# Patient Record
Sex: Female | Born: 1935 | Race: White | Hispanic: No | Smoking: Never smoker
Health system: Southern US, Community
[De-identification: ages and names within clinical notes are randomized; demographics above are authoritative.]

## PROBLEM LIST (undated history)

## (undated) DIAGNOSIS — I82403 Acute embolism and thrombosis of unspecified deep veins of lower extremity, bilateral: Secondary | ICD-10-CM

## (undated) DIAGNOSIS — I10 Essential (primary) hypertension: Secondary | ICD-10-CM

## (undated) DIAGNOSIS — I2699 Other pulmonary embolism without acute cor pulmonale: Secondary | ICD-10-CM

## (undated) DIAGNOSIS — M1990 Unspecified osteoarthritis, unspecified site: Secondary | ICD-10-CM

## (undated) DIAGNOSIS — F32A Depression, unspecified: Secondary | ICD-10-CM

## (undated) DIAGNOSIS — D689 Coagulation defect, unspecified: Secondary | ICD-10-CM

## (undated) DIAGNOSIS — L0390 Cellulitis, unspecified: Secondary | ICD-10-CM

## (undated) DIAGNOSIS — M359 Systemic involvement of connective tissue, unspecified: Secondary | ICD-10-CM

## (undated) DIAGNOSIS — F329 Major depressive disorder, single episode, unspecified: Secondary | ICD-10-CM

## (undated) HISTORY — PX: HERNIA REPAIR: SHX51

## (undated) HISTORY — PX: PARTIAL HYSTERECTOMY: SHX80

## (undated) HISTORY — DX: Essential (primary) hypertension: I10

## (undated) HISTORY — DX: Cellulitis, unspecified: L03.90

## (undated) HISTORY — PX: CATARACT EXTRACTION: SUR2

## (undated) HISTORY — PX: OTHER SURGICAL HISTORY: SHX169

## (undated) HISTORY — PX: FOOT SURGERY: SHX648

---

## 1994-07-18 ENCOUNTER — Ambulatory Visit: Admit: 1994-07-18 | Disposition: A | Discharge status: Home / Self Care | Payer: Self-pay | Source: Ambulatory Visit

## 1995-06-22 ENCOUNTER — Ambulatory Visit: Admit: 1995-06-22 | Disposition: A | Discharge status: Home / Self Care | Payer: Self-pay | Source: Ambulatory Visit

## 1996-04-12 ENCOUNTER — Ambulatory Visit: Admission: RE | Admit: 1996-04-12 | Payer: Self-pay | Source: Ambulatory Visit | Admitting: Foot & Ankle Surgery

## 1996-09-04 ENCOUNTER — Emergency Department: Admit: 1996-09-04 | Payer: Self-pay | Admitting: Emergency Medicine

## 1996-09-05 ENCOUNTER — Ambulatory Visit
Admit: 1996-09-05 | Disposition: A | Discharge status: Home / Self Care | Payer: Self-pay | Source: Ambulatory Visit | Admitting: Family Medicine

## 1999-03-18 ENCOUNTER — Inpatient Hospital Stay
Admission: AD | Admit: 1999-03-18 | Disposition: A | Discharge status: Home / Self Care | Payer: Self-pay | Source: Ambulatory Visit | Admitting: Family Medicine

## 2000-06-23 ENCOUNTER — Ambulatory Visit: Admit: 2000-06-23 | Disposition: A | Discharge status: Home / Self Care | Payer: Self-pay | Source: Ambulatory Visit

## 2002-05-07 ENCOUNTER — Ambulatory Visit: Admission: RE | Admit: 2002-05-07 | Payer: Self-pay | Source: Ambulatory Visit | Admitting: Orthopaedic Surgery

## 2006-05-31 ENCOUNTER — Encounter: Admission: RE | Admit: 2006-05-31 | Discharge: 2006-05-31 | Payer: Self-pay | Admitting: Orthopedic Surgery

## 2007-04-23 ENCOUNTER — Encounter (INDEPENDENT_AMBULATORY_CARE_PROVIDER_SITE_OTHER): Payer: Self-pay | Admitting: Emergency Medicine

## 2007-04-23 ENCOUNTER — Emergency Department (HOSPITAL_COMMUNITY): Admission: EM | Admit: 2007-04-23 | Discharge: 2007-04-23 | Payer: Self-pay | Admitting: Emergency Medicine

## 2007-04-23 ENCOUNTER — Ambulatory Visit: Payer: Self-pay | Admitting: Surgery

## 2007-05-21 ENCOUNTER — Emergency Department (HOSPITAL_COMMUNITY): Admission: EM | Admit: 2007-05-21 | Discharge: 2007-05-22 | Payer: Self-pay | Admitting: Emergency Medicine

## 2007-05-24 ENCOUNTER — Encounter: Admission: RE | Admit: 2007-05-24 | Discharge: 2007-05-24 | Payer: Self-pay | Admitting: Internal Medicine

## 2010-07-12 ENCOUNTER — Encounter: Admission: RE | Admit: 2010-07-12 | Discharge: 2010-07-12 | Payer: Self-pay | Admitting: Surgery

## 2010-08-03 ENCOUNTER — Encounter: Admission: RE | Admit: 2010-08-03 | Discharge: 2010-08-03 | Payer: Self-pay | Admitting: Surgery

## 2010-08-29 HISTORY — PX: PELVIC LAPAROSCOPY: SHX162

## 2010-08-29 HISTORY — PX: ABDOMINAL SURGERY: SHX537

## 2010-08-29 HISTORY — PX: OOPHORECTOMY: SHX86

## 2010-10-28 ENCOUNTER — Ambulatory Visit (INDEPENDENT_AMBULATORY_CARE_PROVIDER_SITE_OTHER): Payer: Medicare Other | Admitting: Obstetrics and Gynecology

## 2010-10-28 DIAGNOSIS — N83209 Unspecified ovarian cyst, unspecified side: Secondary | ICD-10-CM

## 2010-10-28 DIAGNOSIS — N8111 Cystocele, midline: Secondary | ICD-10-CM

## 2010-10-28 DIAGNOSIS — K439 Ventral hernia without obstruction or gangrene: Secondary | ICD-10-CM

## 2010-12-02 ENCOUNTER — Other Ambulatory Visit: Payer: Self-pay | Admitting: Obstetrics and Gynecology

## 2010-12-02 ENCOUNTER — Institutional Professional Consult (permissible substitution) (INDEPENDENT_AMBULATORY_CARE_PROVIDER_SITE_OTHER): Payer: Medicare Other | Admitting: Obstetrics and Gynecology

## 2010-12-02 ENCOUNTER — Encounter (HOSPITAL_COMMUNITY): Payer: Medicare Other

## 2010-12-02 ENCOUNTER — Other Ambulatory Visit (HOSPITAL_COMMUNITY): Payer: Self-pay | Admitting: Surgery

## 2010-12-02 ENCOUNTER — Ambulatory Visit (HOSPITAL_COMMUNITY)
Admission: RE | Admit: 2010-12-02 | Discharge: 2010-12-02 | Disposition: A | Discharge status: Home / Self Care | Payer: Medicare Other | Source: Ambulatory Visit | Attending: Surgery | Admitting: Surgery

## 2010-12-02 DIAGNOSIS — I10 Essential (primary) hypertension: Secondary | ICD-10-CM

## 2010-12-02 DIAGNOSIS — N83209 Unspecified ovarian cyst, unspecified side: Secondary | ICD-10-CM

## 2010-12-02 DIAGNOSIS — Z01810 Encounter for preprocedural cardiovascular examination: Secondary | ICD-10-CM | POA: Insufficient documentation

## 2010-12-02 DIAGNOSIS — Z01812 Encounter for preprocedural laboratory examination: Secondary | ICD-10-CM | POA: Insufficient documentation

## 2010-12-02 DIAGNOSIS — K439 Ventral hernia without obstruction or gangrene: Secondary | ICD-10-CM

## 2010-12-02 DIAGNOSIS — Z01818 Encounter for other preprocedural examination: Secondary | ICD-10-CM | POA: Insufficient documentation

## 2010-12-02 LAB — CBC
Hemoglobin: 16.3 g/dL — ABNORMAL HIGH (ref 12.0–15.0)
MCV: 96.2 fL (ref 78.0–100.0)
Platelets: 207 10*3/uL (ref 150–400)
RBC: 5.21 MIL/uL — ABNORMAL HIGH (ref 3.87–5.11)
WBC: 6.4 10*3/uL (ref 4.0–10.5)

## 2010-12-02 LAB — BASIC METABOLIC PANEL
BUN: 19 mg/dL (ref 6–23)
CO2: 31 mEq/L (ref 19–32)
Chloride: 103 mEq/L (ref 96–112)
GFR calc Af Amer: >60 mL/min (ref >60)
Potassium: 3.8 mEq/L (ref 3.5–5.1)

## 2010-12-08 ENCOUNTER — Inpatient Hospital Stay (HOSPITAL_COMMUNITY)
Admission: RE | Admit: 2010-12-08 | Discharge: 2010-12-16 | DRG: 354 | Disposition: A | Discharge status: Home / Self Care | Payer: Medicare Other | Source: Ambulatory Visit | Attending: Surgery | Admitting: Surgery

## 2010-12-08 ENCOUNTER — Other Ambulatory Visit: Payer: Self-pay | Admitting: Surgery

## 2010-12-08 DIAGNOSIS — Y849 Medical procedure, unspecified as the cause of abnormal reaction of the patient, or of later complication, without mention of misadventure at the time of the procedure: Secondary | ICD-10-CM | POA: Diagnosis not present

## 2010-12-08 DIAGNOSIS — D3910 Neoplasm of uncertain behavior of unspecified ovary: Secondary | ICD-10-CM | POA: Diagnosis present

## 2010-12-08 DIAGNOSIS — I10 Essential (primary) hypertension: Secondary | ICD-10-CM | POA: Diagnosis present

## 2010-12-08 DIAGNOSIS — Z01812 Encounter for preprocedural laboratory examination: Secondary | ICD-10-CM

## 2010-12-08 DIAGNOSIS — K5900 Constipation, unspecified: Secondary | ICD-10-CM | POA: Diagnosis not present

## 2010-12-08 DIAGNOSIS — I808 Phlebitis and thrombophlebitis of other sites: Secondary | ICD-10-CM | POA: Diagnosis not present

## 2010-12-08 DIAGNOSIS — R339 Retention of urine, unspecified: Secondary | ICD-10-CM | POA: Diagnosis not present

## 2010-12-08 DIAGNOSIS — N83209 Unspecified ovarian cyst, unspecified side: Secondary | ICD-10-CM

## 2010-12-08 DIAGNOSIS — Z5331 Laparoscopic surgical procedure converted to open procedure: Secondary | ICD-10-CM

## 2010-12-08 DIAGNOSIS — E669 Obesity, unspecified: Secondary | ICD-10-CM | POA: Diagnosis present

## 2010-12-08 DIAGNOSIS — Z6841 Body Mass Index (BMI) 40.0 and over, adult: Secondary | ICD-10-CM

## 2010-12-08 DIAGNOSIS — K420 Umbilical hernia with obstruction, without gangrene: Principal | ICD-10-CM | POA: Diagnosis present

## 2010-12-09 LAB — CBC
HCT: 44 % (ref 36.0–46.0)
MCV: 96.3 fL (ref 78.0–100.0)
RBC: 4.57 MIL/uL (ref 3.87–5.11)
WBC: 12.5 10*3/uL — ABNORMAL HIGH (ref 4.0–10.5)

## 2010-12-09 LAB — BASIC METABOLIC PANEL
BUN: 16 mg/dL (ref 6–23)
Chloride: 105 mEq/L (ref 96–112)
Glucose, Bld: 166 mg/dL — ABNORMAL HIGH (ref 70–99)
Potassium: 4.4 mEq/L (ref 3.5–5.1)

## 2010-12-11 LAB — CBC
HCT: 38.4 % (ref 36.0–46.0)
MCH: 31.5 pg (ref 26.0–34.0)
MCHC: 32.6 g/dL (ref 30.0–36.0)
MCV: 96.7 fL (ref 78.0–100.0)
RDW: 13.2 % (ref 11.5–15.5)

## 2010-12-11 LAB — BASIC METABOLIC PANEL
BUN: 7 mg/dL (ref 6–23)
Calcium: 8.3 mg/dL — ABNORMAL LOW (ref 8.4–10.5)
GFR calc non Af Amer: >60 mL/min (ref >60)
Glucose, Bld: 132 mg/dL — ABNORMAL HIGH (ref 70–99)

## 2010-12-13 NOTE — Op Note (Signed)
Brittany, [name]               ACCOUNT NO.:  [id number]  MEDICAL RECORD NO.:  [id number]           PATIENT TYPE:  I  LOCATION:  1233                         FACILITY:  [facility]  PHYSICIAN:  Thornton Park. Daphine Deutscher, MD  DATE OF BIRTH:  29-Apr-1935  DATE OF PROCEDURE:  12/08/2010 DATE OF DISCHARGE:                              OPERATIVE REPORT   PREOPERATIVE DIAGNOSES:  Right ovarian mass and large incarcerated umbilical hernia.  POSTOPERATIVE DIAGNOSES:  Right ovarian mass and large incarcerated umbilical hernia.  PROCEDURE:  Laparoscopy with reduction of incarcerated umbilical hernia containing omentum, attempted visualization of the ovaries for oophorectomy, laparotomy performed utilizing the umbilical hernia sac and opening in the fascia, closure of the umbilical hernia along with the midline incision with using retention sutures and interrupted permanent sutures on the fascia.  SURGEON:  Thornton Park. Daphine Deutscher, MD  ASSISTANT:  Rande Brunt. Gottsegen, MD  ESTIMATED BLOOD LOSS:  Probably 300 ccs.  DESCRIPTION OF PROCEDURE:  Brittany [name] is a 76 year old white female who weighs almost 300 pounds and at her age of 76 has had problems with increasing umbilical hernia.  CT scan showed that did contain transverse colon and omentum.  The patient was taken to room #11 on the afternoon of December 08, 2010, and given general anesthesia.  She was placed in the dorsal lithotomy position and Dr. Eda Paschal put a speculum in the vagina and grasped the cervix with a tenaculum.  We entered the abdomen using laparoscopic approach, 5 mm 0 degree Optiview technique through the left upper quadrant and then insufflated.  It was very apparent with this large omental incarceration.  The second 5 was placed in the left lower quadrant and through that I used the EnSeal device as well as blunt dissector to reduce this omental mass.  The actual fascial defect was not that large, but it did contain a large amount  of omentum.  Once this was done, we placed additional 5 over on the right and one lower midline and through these we began to try to manipulate the uterus.  Because of her massive obesity, we were unable to satisfactorily visualize the ovarian pedicles and elected to convert to an open procedure.  We did this by making a midline incision going up including the umbilical hernia.  I stripped out the sac from the surrounding fatty tissue and carried this down to expose the fascial defect which was about the size of a silver dollar.  I then used the Bovie to extend the incision down in the midline.  We used a wound retractor, the Alexis type, and retracted to expose the pelvis.  Dr. Eda Paschal then did the oophorectomy and this is dictated in a separate operative note.  When it was completed, controlled bleeding.  I checked the omentum.  I ran the colon, the bowel, and looked to the transverse colon where the omentum had been taken down and there was no active bleeding seen.  The sigmoid colon looked to be intact, it was very long, and redundant as was the transverse colon.  Marked adiposity was noted in the appendices of epiploica.  The  wound of the abdomen was irrigated and irrigant was retrieved.  I elected to close after sponge and needle count was correct with interrupted #1 Novafils directly on the fascia with 3 retention sutures of #1 nylon with bridges.  Again the sponge and needle counts reported as correct and all the wounds were then closed with staples.  The patient seemed to tolerate the procedure well.  She will go to step-down postoperatively.     Thornton Park Daphine Deutscher, MD     MBM/MEDQ  D:  12/08/2010  T:  12/09/2010  Job:  [id number]  cc:   Thora Lance, M.D. Fax: [phone]  Rande Brunt. Eda Paschal, M.D. Fax: [phone]  Electronically Signed by Luretha Murphy MD on 12/13/2010 09:28:47 AM

## 2010-12-15 NOTE — Op Note (Signed)
Brittany [name], Brittany [name]               ACCOUNT NO.:  [id number]  MEDICAL RECORD NO.:  [id number]           PATIENT TYPE:  O  LOCATION:  DAYL                         FACILITY:  [facility]  PHYSICIAN:  Daniel L. Gottsegen, M.D.DATE OF BIRTH:  1935-04-12  DATE OF PROCEDURE:  12/08/2010 DATE OF DISCHARGE:                              OPERATIVE REPORT   PREOPERATIVE DIAGNOSES:  Right ovarian neoplasm and ventral hernia  POSTOPERATIVE DIAGNOSES:  Right ovarian neoplasm and ventral hernia  OPERATIONS:  Laparoscopy/laparotomy with bilateral salpingo- oophorectomy.  SURGEONS:  Thornton Park. Daphine Deutscher, MD and Rande Brunt. Gottsegen, MD  FINDINGS:  At the time of laparoscopy, the patient did have an incarcerated ventral hernia which was operated on by Dr. Daphine Deutscher.  Please see his operative note.  We were never able to really visualize her ovaries and tubes laparoscopically because of her extreme obesity, difficulty getting her position well enough to be able to see and so much fat around her colon, it was impossible to keep it out of the way of the intended laparoscopic pelvic surgery.  The patient's left ovary and tube were normal.  The patient's right ovary was enlarged by a multi- cystic ovarian neoplasm that looked benign.  It had both solid and cystic components.  There was no peritoneal studding whatsoever.  PROCEDURE:  After adequate general endotracheal anesthesia, the patient was placed in the dorsal supine position, prepped and draped in usual sterile manner.  A Hulka catheter was inserted in the patient's uterus. A Foley catheter was inserted in the patient's bladder.  Dr. Daphine Deutscher started the procedure by doing a laparoscopy, which is dictated in his note.  He did it in such a fashion that he was able to tease all the incarcerated omentum out of the umbilicus and then we were able to place a second trocar in the umbilicus.  She already had one in her left upper quadrant and one in her left side  at the level of the umbilicus.  A fourth was then placed in the right side at the level of the umbilicus. Through this, multiple instruments were placed.  The patient's positioning was changed and in spite of both surgeons trying very persistently and diligently to expose the ovaries and tubes, this was not possible in such a fashion that we could safely do the surgery.  We could not even push up high enough with the uterine elevator to get adequate exposure.  We therefore elected to do a laparotomy and a midline incision was made and carried down through the fascia.  The peritoneum was opened.  Even with a laparotomy, it was extremely difficult to expose what we needed to do.  Peritoneal washings had been obtained laparoscopically.  Finally, the right ovary and tube were elevated.  The proximal blood supply to the ovary and tube, separating the ovary and tube from the uterus could be clamped, cut and suture ligated with 0 Vicryl.  At this point, however, the ovary was free.  It did not appear that enough tension had been placed to tear the blood supply of the infundibulopelvic ligament but we really were having trouble  seeing at this point.  We elected next to go to the left side.  The left ovary and tube could be elevated easier and both the infundibular pelvic ligament and the attachments to the uterus could be clamped, cut, and suture ligated with 0 Vicryl.  The area was carefully inspected.  There was absolutely no bleeding.  Attention was turned back to the right side. We were then able to see where there had been a disruption of the blood supply of the ovarian artery and vein.  This could be grasped to stop the bleeding.  It really was in a position where we could not place sutures without being concerned about injury to the ureter.  The ureter appeared to be extremely deep compared to where we were, so we placed titanium clips on both sides of the bleeder, the clamp was then  removed and there was absolutely no bleeding noted.  Both right and adnexa were reinspected.  There was no bleeding.  At this point the procedure,  was terminated.  Blood loss had been 250 mL and urine was still clear.  At this point, Dr. Daphine Deutscher was going to close the laparotomy and is doing so with repair of ventral hernia and this part is dictated in his operative note.     Daniel L. Eda Paschal, M.D.     Tonette Bihari  D:  12/08/2010  T:  12/08/2010  Job:  [id number]  Electronically Signed by Edyth Gunnels M.D. on 12/15/2010 12:29:15 PM

## 2010-12-24 ENCOUNTER — Inpatient Hospital Stay (HOSPITAL_COMMUNITY)
Admission: EM | Admit: 2010-12-24 | Discharge: 2010-12-28 | DRG: 863 | Disposition: A | Discharge status: Home / Self Care | Payer: Medicare Other | Attending: Surgery | Admitting: Surgery

## 2010-12-24 DIAGNOSIS — I10 Essential (primary) hypertension: Secondary | ICD-10-CM | POA: Diagnosis present

## 2010-12-24 DIAGNOSIS — F411 Generalized anxiety disorder: Secondary | ICD-10-CM | POA: Diagnosis present

## 2010-12-24 DIAGNOSIS — F329 Major depressive disorder, single episode, unspecified: Secondary | ICD-10-CM | POA: Diagnosis present

## 2010-12-24 DIAGNOSIS — L03319 Cellulitis of trunk, unspecified: Secondary | ICD-10-CM | POA: Diagnosis present

## 2010-12-24 DIAGNOSIS — Y836 Removal of other organ (partial) (total) as the cause of abnormal reaction of the patient, or of later complication, without mention of misadventure at the time of the procedure: Secondary | ICD-10-CM | POA: Diagnosis present

## 2010-12-24 DIAGNOSIS — T8140XA Infection following a procedure, unspecified, initial encounter: Principal | ICD-10-CM | POA: Diagnosis present

## 2010-12-24 DIAGNOSIS — G4730 Sleep apnea, unspecified: Secondary | ICD-10-CM | POA: Diagnosis present

## 2010-12-24 DIAGNOSIS — F3289 Other specified depressive episodes: Secondary | ICD-10-CM | POA: Diagnosis present

## 2010-12-24 DIAGNOSIS — F410 Panic disorder [episodic paroxysmal anxiety] without agoraphobia: Secondary | ICD-10-CM | POA: Diagnosis present

## 2010-12-24 DIAGNOSIS — L02219 Cutaneous abscess of trunk, unspecified: Secondary | ICD-10-CM | POA: Diagnosis present

## 2010-12-24 LAB — GRAM STAIN

## 2010-12-24 LAB — CBC
HCT: 42.7 % (ref 36.0–46.0)
MCV: 93.4 fL (ref 78.0–100.0)
RDW: 13.2 % (ref 11.5–15.5)
WBC: 12.1 10*3/uL — ABNORMAL HIGH (ref 4.0–10.5)

## 2010-12-24 LAB — DIFFERENTIAL
Eosinophils Relative: 2 % (ref 0–5)
Lymphocytes Relative: 11 % — ABNORMAL LOW (ref 12–46)
Lymphs Abs: 1.3 10*3/uL (ref 0.7–4.0)
Monocytes Absolute: 1.4 10*3/uL — ABNORMAL HIGH (ref 0.1–1.0)

## 2010-12-25 LAB — BASIC METABOLIC PANEL
CO2: 29 mEq/L (ref 19–32)
Chloride: 101 mEq/L (ref 96–112)
Glucose, Bld: 114 mg/dL — ABNORMAL HIGH (ref 70–99)
Potassium: 4 mEq/L (ref 3.5–5.1)
Sodium: 137 mEq/L (ref 135–145)

## 2010-12-26 LAB — CBC
HCT: 38.2 % (ref 36.0–46.0)
Hemoglobin: 12.6 g/dL (ref 12.0–15.0)
MCH: 31.3 pg (ref 26.0–34.0)
MCHC: 33 g/dL (ref 30.0–36.0)
MCV: 94.8 fL (ref 78.0–100.0)

## 2010-12-28 LAB — WOUND CULTURE

## 2010-12-30 LAB — CULTURE, BLOOD (ROUTINE X 2): Culture  Setup Time: 201204271845

## 2010-12-30 NOTE — Discharge Summary (Signed)
  Brittany [name], Brittany [name]               ACCOUNT NO.:  [id number]  MEDICAL RECORD NO.:  [id number]           PATIENT TYPE:  I  LOCATION:  1538                         FACILITY:  [facility]  PHYSICIAN:  Thornton Park. Daphine Deutscher, MD  DATE OF BIRTH:  10-Mar-1935  DATE OF ADMISSION:  12/24/2010 DATE OF DISCHARGE:  12/28/2010                              DISCHARGE SUMMARY   ADMITTING DIAGNOSIS:  Abdominal cellulitis surrounding retention sutures.  COURSE IN THE HOSPITAL:  [name] was admitted from the ER.  I had seen her in the office a day before and put her on some cephalexin for a cellulitis of her anterior abdominal wall.  She is morbidly obese lady and had retention sutures.  The plastic bridges had rubbed some areas on her skin laterally and this set a focus for cellulitis.  This got worse and she was admitted with fever.  I opened midpart and went down in the wound and cultured this.  This did not grow out any organisms.  However, the wound responded to Unasyn and Rocephin and by discharge date, there was some mild pannus but everything looked to be healing.  She will be followed at home on Augmentin 875 daily, to be followed up in the office either in 3 days or in 10 days; if she does well, I will see her in 10 days.  FINAL DIAGNOSIS:  Abdominal wall cellulitis status post antibiotic treatment with intravenous antibiotics and oral agents.     Thornton Park Daphine Deutscher, MD     MBM/MEDQ  D:  12/28/2010  T:  12/28/2010  Job:  [id number]  Electronically Signed by Luretha Murphy MD on 12/30/2010 08:36:58 AM

## 2010-12-30 NOTE — Discharge Summary (Signed)
  [name], [name]               ACCOUNT NO.:  [id number]  MEDICAL RECORD NO.:  [id number]           PATIENT TYPE:  I  LOCATION:  1526                         FACILITY:  [facility]  PHYSICIAN:  Thornton Park. Daphine Deutscher, MD  DATE OF BIRTH:  Dec 25, 1934  DATE OF ADMISSION:  12/08/2010 DATE OF DISCHARGE:  12/16/2010                              DISCHARGE SUMMARY   ADMITTING DIAGNOSES:  Ventral hernia and right ovarian mass.  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  COURSE IN HOSPITAL:  Brittany [name] was an a.m. admission.  She arrived in the operating room on December 08, 2010, and underwent a laparoscopically attempted and converted to an open repair of an incarcerated umbilical hernia and then oophorectomy.  Her hernia was closed primarily with mini sutures and retention sutures.  She came along slowly because of her obesity and her discomfort from the abdominal repair.  Fortunately, her path did not indicate anything more serious than it was and we moved her along, beginning advancing her diet.  She did, however, develop some phlebitis in her right arm, which was treated by a K-pad and back on her aspirin.  She was asking and wanting to go home on postop day #8 and because I thought she would rest better, arrangements were made for her to have a K-pad at home and follow up in 1 week to take out some of her staples.  She was given a prescription for Percocet 5/325 (#40).  She was also instructed to use a stool softener as she had been little constipated and we had to open her up with some GoLYTELY.  Condition improved.     Thornton Park Daphine Deutscher, MD     MBM/MEDQ  D:  12/16/2010  T:  12/16/2010  Job:  [id number]  cc:   Reuel Boom L. Eda Paschal, M.D. Fax: [phone]  Thora Lance, M.D. Fax: [phone]  Electronically Signed by Luretha Murphy MD on 12/30/2010 08:36:56 AM

## 2011-01-04 ENCOUNTER — Ambulatory Visit (INDEPENDENT_AMBULATORY_CARE_PROVIDER_SITE_OTHER): Payer: Medicare Other | Admitting: Obstetrics and Gynecology

## 2011-01-04 DIAGNOSIS — R350 Frequency of micturition: Secondary | ICD-10-CM

## 2011-01-04 DIAGNOSIS — N3941 Urge incontinence: Secondary | ICD-10-CM

## 2011-01-04 DIAGNOSIS — N951 Menopausal and female climacteric states: Secondary | ICD-10-CM

## 2011-01-07 NOTE — H&P (Signed)
NAMECAMRON, [name]               ACCOUNT NO.:  [id number]  MEDICAL RECORD NO.:  [id number]           PATIENT TYPE:  E  LOCATION:  WLED                         FACILITY:  [facility]  PHYSICIAN:  Thornton Park. Daphine Deutscher, MD  DATE OF BIRTH:  10-13-1934  DATE OF ADMISSION:  12/24/2010 DATE OF DISCHARGE:                             HISTORY & PHYSICAL   CHIEF COMPLAINT:  Abdominal wound.  PRIMARY CARE:  Thora Lance, M.D.   GYNECOLOGIC DOCTOR:  Rande Brunt. Eda Paschal, M.D.  BRIEF HISTORY:  The patient is a 76 year old white female who was found to have a right ovarian mass.  She was hospitalized April 11 through December 16, 2010.  At that time, they attempted to do the ovarian mass through a laparoscopic procedure.  Unfortunately, they were not able to do that, and he converted to an open procedure.  This was done by Dr. Daphine Deutscher and Dr. Eda Paschal.  Postoperatively, she had some difficulty with mobilization and some phlebitis all of which resolved.  She was discharged home on December 16, 2010 and over the last 48 hours has noticed some redness, increasing tenderness and some drainage on her gown from the abdominal incision.  She was seen in the office yesterday and was placed on Keflex but since yesterday has had more drainage, more discomfort and spiked a fever to 102 at home.  She subsequently called Dr. Daphine Deutscher, he sent her to the ER at [facility] where he has seen her and removed her remaining staples and cultured the wound.  We plan to admit her for further treatment including hydration, IV antibiotics.  PAST MEDICAL HISTORY: 1. Hypertension. 2. Depression with panic attacks.  She had a panic attack this     morning. 3. Questionable sleep apnea. 4. Morbid obesity.  PAST SURGICAL HISTORY:  Lap bilateral salpingo-oophorectomy on December 08, 2010 converted to an open procedure with reduction of incarcerated umbilical hernia on April 12 by Dr. Daphine Deutscher and Dr. Eda Paschal.  FAMILY HISTORY:   Father died of brain aneurysm at 60.  Mother had 5 heart attacks before she died in her 47s.  Two brothers and one sister. One brother has deceased from hypertension.  The remaining siblings are in good health.  SOCIAL HISTORY:  Tobacco, she smoked at least 25 years less than a pack per day.  Alcohol none.  She is widowed.  Drugs, none.  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  REVIEW OF SYSTEMS:  CONSTITUTIONAL:  Fever positive up to 102 yesterday. Weight, no changes.  PSYCHIATRIC:  Her depression, anxiety fluctuate. She had a panic attack earlier today.  CV:  Negative for headaches, dizziness, syncope, presyncope.  PULMONARY:  There is a question of whether she has sleep apnea.  She does not like lying down flat but denies orthopnea.  She has dyspnea on exertion.  No coughing or wheezing.  CARDIAC:  No history of chest pain or palpitations.  GI: Negative for GERD.  She has had nausea on and off for 2 weeks.  She has had problems with constipation, was treated with GoLYTELY last admission.  No blood in  her stool.  GU:  No problems voiding.  LOWER EXTREMITIES:  She has chronic hip and knee pain secondary to her weight. She also has problems with lower extremity edema.  SKIN:  No changes except for the abdomen.  HOME MEDICATIONS: 1. Percocet 1-2 q.4 h. p.r.n. 2. Benicar HCT 40/25 one daily. 3. Effexor XR 75 mg 3 tablets daily. 4. Norvasc 5 mg daily. 5. Wellbutrin 150 mg daily.  ALLERGIES:  MORPHINE causes nausea.  PHYSICAL EXAMINATION:  GENERAL:  This is an overweight white female, somewhat anxious but in no acute distress.  Her sats are down to 89% on room air, but she is somewhat anxious with the oxygen on. VITAL SIGNS:  Height is 66 inches, weight is 131.6 kg during the last admission.  Temperature at home was 102, here is 98.6.  Heart rate is 90, blood pressure is 103/70. HEENT:  Head normocephalic.  Eyes, ears, nose, throat and mouth are grossly normal. NECK:   Trachea is midline.  Thyroid is nonpalpable.  There is no JVD or bruits. CHEST:  Clear to auscultation. CARDIAC:  No murmurs or rubs.  Normal S1 and S2.  Pulses are +2 and equal upper and lower extremities.  Chest is not tender, but she had a good deal of pain sitting up to allow me to listen to her chest. ABDOMEN:  She is morbidly obese.  There is erythema along and around the incisions.  Staples have been removed.  There is an area lateral on the right side to the umbilical hernia, which has rather dry purulent looking material in the midline, has some serous drainage after staple removal, has erythema and it is all extremely tender.  There are no masses or abscess noted.  She does have an umbilical hernia. GU/RECTAL:  Deferred. LYMPHATIC:  Lymphadenopathy none palpated. MUSCULOSKELETAL:  No abnormal changes noted. SKIN:  Erythema in the abdomen was the only abnormality.  She also has ecchymosis from her Lovenox injections. NEUROLOGIC:  No focal deficits.  Cranial nerves II through XII are normal. PSYCHIATRIC:  She is slightly anxious but otherwise normal.  LABORATORY DATA:  Pending.  DIAGNOSTICS:  Not applicable.  IMPRESSION: 1. Cellulitis of the abdominal wall incision. 2. Status post attempted laparoscopic salpingo-oophorectomy with     conversion to open procedure and repair of incarcerated umbilical     hernia. 3. Hypertension. 4. Anxiety/depression/anxiety attacks.  PLAN:  Local wound care and antibiotics.  Further workup and treatment as indicated.     Eber Hong, P.A.   ______________________________ Thornton Park Daphine Deutscher, MD    WDJ/MEDQ  D:  12/24/2010  T:  12/24/2010  Job:  [id number]  cc:   Thora Lance, M.D. Fax: [phone]  Rande Brunt. Eda Paschal, M.D. Fax: [phone]  Electronically Signed by Sherrie George P.A. on 01/01/2011 08:54:21 PM Electronically Signed by Luretha Murphy MD on 01/07/2011 08:35:58 AM

## 2011-03-31 ENCOUNTER — Other Ambulatory Visit: Payer: Self-pay

## 2011-03-31 MED ORDER — DARIFENACIN HYDROBROMIDE ER 7.5 MG PO TB24: 7.5000 mg | ORAL_TABLET | Freq: Every day | ORAL | Status: DC

## 2011-03-31 NOTE — Telephone Encounter (Signed)
It is okay to refill 

## 2011-03-31 NOTE — Telephone Encounter (Signed)
PER DR. GOTTSEGEN,SIGNED [facility]& I FAXED TO THEM.

## 2011-03-31 NOTE — Telephone Encounter (Signed)
PT MUS TUSE MAIL ORDER NOW FOR HER [employer] TO CONTINUE TO PAY FOR RX & IT MUST BE FAXED TO THEM.

## 2011-05-06 ENCOUNTER — Encounter (INDEPENDENT_AMBULATORY_CARE_PROVIDER_SITE_OTHER): Payer: Self-pay | Admitting: Surgery

## 2011-06-21 ENCOUNTER — Ambulatory Visit (INDEPENDENT_AMBULATORY_CARE_PROVIDER_SITE_OTHER): Payer: Medicare Other | Admitting: Surgery

## 2011-06-21 ENCOUNTER — Encounter (INDEPENDENT_AMBULATORY_CARE_PROVIDER_SITE_OTHER): Payer: Self-pay | Admitting: Surgery

## 2011-06-21 VITALS — BP 134/90 | HR 92 | Temp 97.5°F | Resp 20 | Ht 65.5 in | Wt 291.4 lb

## 2011-06-21 DIAGNOSIS — K439 Ventral hernia without obstruction or gangrene: Secondary | ICD-10-CM

## 2011-06-21 NOTE — Progress Notes (Signed)
Brittany [name] returns in followup from her surgery in April 2012.  She is set to see Dr. Eda Paschal next week.    Exam she has a small umbilical hernia that I can feel but it is nothing that I would repair given her weight.  Asymptomatic.    Her 74 year old daughter who lives in the [place] area has recurrent breast cancer that is metastatic to her liver.    Return prn

## 2011-06-23 ENCOUNTER — Ambulatory Visit (INDEPENDENT_AMBULATORY_CARE_PROVIDER_SITE_OTHER): Payer: Medicare Other | Admitting: Surgery

## 2011-09-08 ENCOUNTER — Ambulatory Visit (INDEPENDENT_AMBULATORY_CARE_PROVIDER_SITE_OTHER): Payer: Medicare Other | Admitting: Obstetrics and Gynecology

## 2011-09-08 DIAGNOSIS — N393 Stress incontinence (female) (male): Secondary | ICD-10-CM

## 2011-09-08 DIAGNOSIS — I10 Essential (primary) hypertension: Secondary | ICD-10-CM | POA: Diagnosis not present

## 2011-09-08 DIAGNOSIS — N952 Postmenopausal atrophic vaginitis: Secondary | ICD-10-CM

## 2011-09-08 DIAGNOSIS — D3910 Neoplasm of uncertain behavior of unspecified ovary: Secondary | ICD-10-CM

## 2011-09-08 DIAGNOSIS — H269 Unspecified cataract: Secondary | ICD-10-CM

## 2011-09-08 NOTE — Progress Notes (Signed)
The patient came back to see me today for multiproblem visit. The first issue is that she needs followup for her serous borderline tumor of the right ovary which we removed in April of 2012. She underwent bilateral salpingo-oophorectomy that day. She still has her uterus because of her size we felt hysterectomy was to greater risk. We also did not know at the time of surgery that it was a borderline tumor. I had a phone consultation with Dr. Velora Heckler afterwards and she did not feel she should undergo any more surgery. She has done well since the surgery. She did have to be readmitted with skin cellulitis and wound separation but did well with antibiotics. She is having no pelvic pain whatsoever. She is having no vaginal bleeding. Her preoperative CA 125 was normal at 25. Her second problem is urinary incontinence. It occurs in the middle of night when she gets out of bed. She cannot make it to the toilet. She has nocturia x3. She has no dysuria or hematuria. We initially treated her with 7.5 mg of enablex with excellent results. She however now reports that it is no longer working.  ROS: General: Increased weight. Skin: Within normal limits. ENT: Headaches and lightheadedness. Eyes: Within normal limits. Cardiovascular: Hypertension and painful legs when walking respiratory: Coughing and night sweats. GI: Belching. Musculoskeletal: Joint pain and muscular weakness. GU: See above neurological/psychiatric: Within normal limits allergic immunological endolymphatic endocrine: Within normal limits except for drug allergies.  Physical examination: Kennon Portela present HEENT within normal limits. Neck: Thyroid not large. No masses. Supraclavicular nodes: not enlarged. Breasts: Examined in both sitting midline position. No skin changes and no masses. Abdomen: Soft no guarding rebound or masses or hernia. Pelvic: External: Within normal limits. BUS: Within normal limits. Vaginal:within normal limits. Poor  estrogen effect. No evidence of cystocele enterocele. Small rectocele. Cervix: clean. Uterus: Normal size and shape. Adnexa: No masses. Rectovaginal exam: Confirmatory and negative. Extremities: Within normal limits.  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 Atrophic vaginitis  Plan: Increased her enablex to 15 mg daily. Discussed other drugs if this doesn't work. Discussed vaginal estrogen for the bladder issue. Continue yearly mammograms. Call if any pelvic pain. No CA 125 done at her preop was normal. Return to office in 6 months.

## 2011-09-09 ENCOUNTER — Telehealth: Payer: Self-pay | Admitting: *Deleted

## 2011-09-09 MED ORDER — DARIFENACIN HYDROBROMIDE ER 15 MG PO TB24: 15.0000 mg | ORAL_TABLET | Freq: Every day | ORAL | Status: DC

## 2011-09-09 NOTE — Telephone Encounter (Signed)
Pt was seen 09/08/11 and giving 3 bottle samples of enablex 15 mg and the expiration date said July 2012. Pt wants to know okay to take medication? Please advise

## 2011-09-09 NOTE — Telephone Encounter (Signed)
Pt called back and she would like rx for enablex 15 mg per DG rx sent to pharmacy.

## 2011-09-09 NOTE — Telephone Encounter (Signed)
Left the below note on pt vm, told her to call back if she would like rx for this medication.

## 2011-09-09 NOTE — Telephone Encounter (Signed)
There is no danger in her taking it. However it may not be as effective. Suggest you call her in a month supply of it.

## 2011-10-03 ENCOUNTER — Encounter: Payer: Self-pay | Admitting: *Deleted

## 2011-10-03 NOTE — Progress Notes (Signed)
Patient ID: Brittany [name], female   DOB: Jun 17, 1935, 77 y.o.   MRN: [id number] Pt called stating that the enablex 15 mg is not working, pt would like something else. I told pt that Dr. Reece Agar is out office and I will send message to him once he returns. Pt okay with this.

## 2011-10-12 ENCOUNTER — Telehealth: Payer: Self-pay | Admitting: *Deleted

## 2011-10-12 MED ORDER — FESOTERODINE FUMARATE ER 4 MG PO TB24: 4.0000 mg | ORAL_TABLET | Freq: Every day | ORAL | Status: DC

## 2011-10-12 NOTE — Telephone Encounter (Signed)
Toviaz 4 mgm. daily

## 2011-10-12 NOTE — Telephone Encounter (Signed)
Pt informed with the below note, rx sent to pharmacy. 

## 2011-10-12 NOTE — Telephone Encounter (Signed)
Pt called stating that her enablex 15 mg tablet given on office visit 09/08/11 is not working. Pt was told to follow up if medication is not working. Please advise

## 2011-10-13 ENCOUNTER — Other Ambulatory Visit: Payer: Self-pay | Admitting: *Deleted

## 2011-10-13 MED ORDER — SOLIFENACIN SUCCINATE 5 MG PO TABS: 5.0000 mg | ORAL_TABLET | Freq: Every day | ORAL | Status: DC

## 2012-01-19 ENCOUNTER — Other Ambulatory Visit: Payer: Self-pay | Admitting: Obstetrics and Gynecology

## 2012-01-19 ENCOUNTER — Ambulatory Visit (INDEPENDENT_AMBULATORY_CARE_PROVIDER_SITE_OTHER): Payer: Medicare Other | Admitting: Obstetrics and Gynecology

## 2012-01-19 DIAGNOSIS — N816 Rectocele: Secondary | ICD-10-CM

## 2012-01-19 DIAGNOSIS — IMO0002 Reserved for concepts with insufficient information to code with codable children: Secondary | ICD-10-CM

## 2012-01-19 DIAGNOSIS — R32 Unspecified urinary incontinence: Secondary | ICD-10-CM | POA: Diagnosis not present

## 2012-01-19 DIAGNOSIS — N8111 Cystocele, midline: Secondary | ICD-10-CM

## 2012-01-19 DIAGNOSIS — R3129 Other microscopic hematuria: Secondary | ICD-10-CM | POA: Diagnosis not present

## 2012-01-19 LAB — URINALYSIS W MICROSCOPIC + REFLEX CULTURE
Casts: NONE SEEN
Crystals: NONE SEEN
Glucose, UA: NEGATIVE mg/dL
Leukocytes, UA: NEGATIVE
Specific Gravity, Urine: >1.03 — ABNORMAL HIGH (ref 1.005–1.030)
pH: 5 (ref 5.0–8.0)

## 2012-01-19 NOTE — Progress Notes (Signed)
The patient came back today because she continues to have loss of urine when she stands up. She has some frequency and urgency as well. She does not have urinary stress incontinence. She tried Enablex 7.5 mg which initially worked but then stopped working and then she tried Enablex 15 mg which did not work at all. We tried to get her approved for Gala Murdoch but  were unsuccessful so she is currently on Vesicare 5 mg with no help.  Exam: External: Within normal limits BUS: Within normal limits vaginal: With valsalva patient has 1-1/2 cystocele. She has a second-degree rectocele. Cervix is clean. Uterus is normal size and shape. Adnexa failed to reveal masses. Kennon Portela  present during exam.  Assessment: Incontinence not related to stress. Small cystocele. Rectocele.  Plan: Cathed  urine obtained for urinalysis and culture. Samples of Toviaz 4 mg given. Urology consult. Addendum: urine specimen showed too numerous to count red blood cells and bacteria.  We will await and see what culture shows.

## 2012-01-20 ENCOUNTER — Telehealth: Payer: Self-pay | Admitting: *Deleted

## 2012-01-20 NOTE — Telephone Encounter (Signed)
Apt with Dr Sherron Monday 130pm arrive at115pm will fax records. Pt informed.

## 2012-01-20 NOTE — Telephone Encounter (Signed)
Message copied by Richardson Chiquito on Fri Jan 20, 2012 11:59 AM ------      Message from: Trellis Paganini      Created: Thu Jan 19, 2012  2:19 PM       Pt has urinary incontinence which is not stress. She has not responded to enablex or vesicare and is now on [place] Please make her an appointment to see with Dr. Jerrye Beavers or Dr. Logan Bores (whomever can see her first. thanks

## 2012-01-21 LAB — URINE CULTURE: Colony Count: NO GROWTH

## 2012-02-02 ENCOUNTER — Telehealth: Payer: Self-pay | Admitting: *Deleted

## 2012-02-02 MED ORDER — FESOTERODINE FUMARATE ER 4 MG PO TB24: 4.0000 mg | ORAL_TABLET | Freq: Every day | ORAL | Status: DC

## 2012-02-02 NOTE — Telephone Encounter (Signed)
Pt called requesting rx for toviaz 4 mg be sent to [facility] on [address], pt has samples and have worked great, she would like to see if insurance would pay for medication again. rx will be sent.

## 2012-02-06 DIAGNOSIS — H43399 Other vitreous opacities, unspecified eye: Secondary | ICD-10-CM | POA: Diagnosis not present

## 2012-02-06 DIAGNOSIS — D3130 Benign neoplasm of unspecified choroid: Secondary | ICD-10-CM | POA: Diagnosis not present

## 2012-02-06 DIAGNOSIS — H52229 Regular astigmatism, unspecified eye: Secondary | ICD-10-CM | POA: Diagnosis not present

## 2012-02-06 DIAGNOSIS — H5200 Hypermetropia, unspecified eye: Secondary | ICD-10-CM | POA: Diagnosis not present

## 2012-02-07 ENCOUNTER — Telehealth: Payer: Self-pay | Admitting: *Deleted

## 2012-02-07 MED ORDER — TOLTERODINE TARTRATE 2 MG PO TABS: 2.0000 mg | ORAL_TABLET | Freq: Two times a day (BID) | ORAL | Status: DC

## 2012-02-07 MED ORDER — TOLTERODINE TARTRATE ER 4 MG PO CP24: 4.0000 mg | ORAL_CAPSULE | Freq: Every day | ORAL | Status: DC

## 2012-02-07 NOTE — Telephone Encounter (Signed)
Im sorry the "Detrol LA" does not come generic per pharmacist.  They said plain "Detrol 1mg  or 2mg " does come generic.  Can we change rx?

## 2012-02-07 NOTE — Telephone Encounter (Signed)
Okay to give 2 mg. Check with the pharmacist but I believe the 2 mg should be twice a day.

## 2012-02-07 NOTE — Telephone Encounter (Signed)
Addended byMckinley Jewel, Gwendolyn Nishi L on: 02/07/2012 11:06 AM   Modules accepted: Orders

## 2012-02-07 NOTE — Telephone Encounter (Signed)
Yes. Give her a 4 mg.

## 2012-02-07 NOTE — Telephone Encounter (Signed)
Patient wanted [place] to try again to get Toviaz approved.  Called insurance and she will have to try one more option before approval.  She will need to try Generic Detrol LA.  Can we send in rx? She has an upcoming appt next month with Urologist.

## 2012-02-07 NOTE — Telephone Encounter (Signed)
Addended byValeda Malm L on: 02/07/2012 04:02 PM   Modules accepted: Orders

## 2012-02-07 NOTE — Telephone Encounter (Signed)
rx changed with pharmacist.

## 2012-02-07 NOTE — Telephone Encounter (Signed)
rx sent in 

## 2012-02-14 DIAGNOSIS — I10 Essential (primary) hypertension: Secondary | ICD-10-CM | POA: Diagnosis not present

## 2012-02-14 DIAGNOSIS — M542 Cervicalgia: Secondary | ICD-10-CM | POA: Diagnosis not present

## 2012-03-25 DIAGNOSIS — R05 Cough: Secondary | ICD-10-CM | POA: Diagnosis not present

## 2012-03-25 DIAGNOSIS — J019 Acute sinusitis, unspecified: Secondary | ICD-10-CM | POA: Diagnosis not present

## 2012-04-17 DIAGNOSIS — Z1331 Encounter for screening for depression: Secondary | ICD-10-CM | POA: Diagnosis not present

## 2012-04-17 DIAGNOSIS — M542 Cervicalgia: Secondary | ICD-10-CM | POA: Diagnosis not present

## 2012-04-17 DIAGNOSIS — IMO0002 Reserved for concepts with insufficient information to code with codable children: Secondary | ICD-10-CM | POA: Diagnosis not present

## 2012-04-17 DIAGNOSIS — I10 Essential (primary) hypertension: Secondary | ICD-10-CM | POA: Diagnosis not present

## 2012-04-18 ENCOUNTER — Other Ambulatory Visit: Payer: Self-pay | Admitting: Obstetrics and Gynecology

## 2012-04-19 DIAGNOSIS — R351 Nocturia: Secondary | ICD-10-CM | POA: Diagnosis not present

## 2012-04-19 DIAGNOSIS — R319 Hematuria, unspecified: Secondary | ICD-10-CM | POA: Diagnosis not present

## 2012-04-19 DIAGNOSIS — N3941 Urge incontinence: Secondary | ICD-10-CM | POA: Diagnosis not present

## 2012-05-07 DIAGNOSIS — M542 Cervicalgia: Secondary | ICD-10-CM | POA: Diagnosis not present

## 2012-05-15 DIAGNOSIS — M542 Cervicalgia: Secondary | ICD-10-CM | POA: Diagnosis not present

## 2012-05-18 DIAGNOSIS — M542 Cervicalgia: Secondary | ICD-10-CM | POA: Diagnosis not present

## 2012-05-21 DIAGNOSIS — M542 Cervicalgia: Secondary | ICD-10-CM | POA: Diagnosis not present

## 2012-05-24 DIAGNOSIS — M542 Cervicalgia: Secondary | ICD-10-CM | POA: Diagnosis not present

## 2012-05-29 DIAGNOSIS — M542 Cervicalgia: Secondary | ICD-10-CM | POA: Diagnosis not present

## 2012-05-30 DIAGNOSIS — N3941 Urge incontinence: Secondary | ICD-10-CM | POA: Diagnosis not present

## 2012-05-30 DIAGNOSIS — R351 Nocturia: Secondary | ICD-10-CM | POA: Diagnosis not present

## 2012-05-31 DIAGNOSIS — M542 Cervicalgia: Secondary | ICD-10-CM | POA: Diagnosis not present

## 2012-06-03 ENCOUNTER — Other Ambulatory Visit: Payer: Self-pay

## 2012-06-03 ENCOUNTER — Inpatient Hospital Stay (HOSPITAL_COMMUNITY)
Admission: EM | Admit: 2012-06-03 | Discharge: 2012-06-09 | DRG: 299 | Disposition: A | Discharge status: Home Health | Payer: Medicare Other | Attending: Internal Medicine | Admitting: Internal Medicine

## 2012-06-03 ENCOUNTER — Emergency Department (HOSPITAL_COMMUNITY): Payer: Medicare Other

## 2012-06-03 ENCOUNTER — Encounter (HOSPITAL_COMMUNITY): Payer: Self-pay | Admitting: Emergency Medicine

## 2012-06-03 DIAGNOSIS — I10 Essential (primary) hypertension: Secondary | ICD-10-CM | POA: Diagnosis not present

## 2012-06-03 DIAGNOSIS — M79609 Pain in unspecified limb: Secondary | ICD-10-CM | POA: Diagnosis not present

## 2012-06-03 DIAGNOSIS — D3910 Neoplasm of uncertain behavior of unspecified ovary: Secondary | ICD-10-CM | POA: Diagnosis present

## 2012-06-03 DIAGNOSIS — E876 Hypokalemia: Secondary | ICD-10-CM | POA: Diagnosis present

## 2012-06-03 DIAGNOSIS — J45901 Unspecified asthma with (acute) exacerbation: Secondary | ICD-10-CM | POA: Diagnosis present

## 2012-06-03 DIAGNOSIS — D6859 Other primary thrombophilia: Secondary | ICD-10-CM | POA: Diagnosis present

## 2012-06-03 DIAGNOSIS — J45902 Unspecified asthma with status asthmaticus: Secondary | ICD-10-CM | POA: Diagnosis not present

## 2012-06-03 DIAGNOSIS — E669 Obesity, unspecified: Secondary | ICD-10-CM | POA: Diagnosis present

## 2012-06-03 DIAGNOSIS — I82409 Acute embolism and thrombosis of unspecified deep veins of unspecified lower extremity: Secondary | ICD-10-CM | POA: Diagnosis present

## 2012-06-03 DIAGNOSIS — Z6841 Body Mass Index (BMI) 40.0 and over, adult: Secondary | ICD-10-CM | POA: Diagnosis not present

## 2012-06-03 DIAGNOSIS — I824Z9 Acute embolism and thrombosis of unspecified deep veins of unspecified distal lower extremity: Secondary | ICD-10-CM | POA: Diagnosis not present

## 2012-06-03 DIAGNOSIS — I2699 Other pulmonary embolism without acute cor pulmonale: Secondary | ICD-10-CM | POA: Diagnosis not present

## 2012-06-03 DIAGNOSIS — R0602 Shortness of breath: Secondary | ICD-10-CM | POA: Diagnosis not present

## 2012-06-03 HISTORY — DX: Depression, unspecified: F32.A

## 2012-06-03 HISTORY — DX: Unspecified osteoarthritis, unspecified site: M19.90

## 2012-06-03 HISTORY — DX: Coagulation defect, unspecified: D68.9

## 2012-06-03 HISTORY — DX: Major depressive disorder, single episode, unspecified: F32.9

## 2012-06-03 LAB — URINE MICROSCOPIC-ADD ON

## 2012-06-03 LAB — URINALYSIS, ROUTINE W REFLEX MICROSCOPIC
Leukocytes, UA: NEGATIVE
Nitrite: NEGATIVE
Protein, ur: 30 mg/dL — AB

## 2012-06-03 LAB — COMPREHENSIVE METABOLIC PANEL
BUN: 16 mg/dL (ref 6–23)
CO2: 30 mEq/L (ref 19–32)
Calcium: 9.2 mg/dL (ref 8.4–10.5)
Creatinine, Ser: 0.67 mg/dL (ref 0.50–1.10)
GFR calc Af Amer: >90 mL/min (ref >90)
GFR calc non Af Amer: 84 mL/min — ABNORMAL LOW (ref >90)
Glucose, Bld: 153 mg/dL — ABNORMAL HIGH (ref 70–99)

## 2012-06-03 LAB — ANTITHROMBIN III: AntiThromb III Func: 97 % (ref 75–120)

## 2012-06-03 LAB — CBC WITH DIFFERENTIAL/PLATELET
Eosinophils Relative: 0 % (ref 0–5)
HCT: 46.2 % — ABNORMAL HIGH (ref 36.0–46.0)
Lymphocytes Relative: 8 % — ABNORMAL LOW (ref 12–46)
Lymphs Abs: 1 10*3/uL (ref 0.7–4.0)
MCV: 91.8 fL (ref 78.0–100.0)
Monocytes Absolute: 1.2 10*3/uL — ABNORMAL HIGH (ref 0.1–1.0)
RBC: 5.03 MIL/uL (ref 3.87–5.11)
RDW: 12.7 % (ref 11.5–15.5)
WBC: 11.6 10*3/uL — ABNORMAL HIGH (ref 4.0–10.5)

## 2012-06-03 LAB — PRO B NATRIURETIC PEPTIDE: Pro B Natriuretic peptide (BNP): 301.6 pg/mL (ref 0–450)

## 2012-06-03 LAB — HEPARIN LEVEL (UNFRACTIONATED): Heparin Unfractionated: 0.15 IU/mL — ABNORMAL LOW (ref 0.30–0.70)

## 2012-06-03 MED ORDER — ZOLPIDEM TARTRATE 5 MG PO TABS: 5.0000 mg | ORAL_TABLET | Freq: Every evening | ORAL | Status: DC | PRN

## 2012-06-03 MED ORDER — AMLODIPINE BESYLATE 5 MG PO TABS
5.0000 mg | ORAL_TABLET | Freq: Every morning | ORAL | Status: DC
  Administered 2012-06-03 – 2012-06-05 (×3): 5 mg via ORAL
  Filled 2012-06-03 (×3): qty 1

## 2012-06-03 MED ORDER — SENNA 8.6 MG PO TABS
1.0000 | ORAL_TABLET | Freq: Two times a day (BID) | ORAL | Status: DC
Start: 2012-06-03 — End: 2012-06-09
  Administered 2012-06-03 – 2012-06-08 (×10): 8.6 mg via ORAL
  Filled 2012-06-03 (×10): qty 1

## 2012-06-03 MED ORDER — COUMADIN BOOK
Freq: Once | Status: AC
  Administered 2012-06-03: 18:00:00
  Filled 2012-06-03: qty 1

## 2012-06-03 MED ORDER — WARFARIN SODIUM 7.5 MG PO TABS
7.5000 mg | ORAL_TABLET | Freq: Once | ORAL | Status: AC
  Administered 2012-06-03: 7.5 mg via ORAL
  Filled 2012-06-03: qty 1

## 2012-06-03 MED ORDER — WARFARIN VIDEO
Freq: Once | Status: AC
  Administered 2012-06-04 (×2)

## 2012-06-03 MED ORDER — MORPHINE SULFATE 4 MG/ML IJ SOLN
6.0000 mg | Freq: Once | INTRAMUSCULAR | Status: AC
  Administered 2012-06-03: 6 mg via INTRAVENOUS
  Filled 2012-06-03: qty 2

## 2012-06-03 MED ORDER — VENLAFAXINE HCL ER 75 MG PO CP24
225.0000 mg | ORAL_CAPSULE | Freq: Every morning | ORAL | Status: DC
  Administered 2012-06-03 – 2012-06-09 (×7): 225 mg via ORAL
  Filled 2012-06-03 (×7): qty 1

## 2012-06-03 MED ORDER — IOHEXOL 350 MG/ML SOLN
100.0000 mL | Freq: Once | INTRAVENOUS | Status: AC | PRN
  Administered 2012-06-03: 100 mL via INTRAVENOUS

## 2012-06-03 MED ORDER — ACETAMINOPHEN 325 MG PO TABS
650.0000 mg | ORAL_TABLET | Freq: Four times a day (QID) | ORAL | Status: DC | PRN
  Administered 2012-06-03 – 2012-06-04 (×4): 650 mg via ORAL
  Filled 2012-06-03 (×4): qty 2

## 2012-06-03 MED ORDER — ACETAMINOPHEN 650 MG RE SUPP: 650.0000 mg | Freq: Four times a day (QID) | RECTAL | Status: DC | PRN

## 2012-06-03 MED ORDER — SODIUM CHLORIDE 0.9 % IV SOLN
INTRAVENOUS | Status: DC
  Administered 2012-06-06: 01:00:00 via INTRAVENOUS

## 2012-06-03 MED ORDER — DOCUSATE SODIUM 100 MG PO CAPS
100.0000 mg | ORAL_CAPSULE | Freq: Two times a day (BID) | ORAL | Status: DC
  Administered 2012-06-03 – 2012-06-09 (×13): 100 mg via ORAL
  Filled 2012-06-03 (×14): qty 1

## 2012-06-03 MED ORDER — OLMESARTAN MEDOXOMIL-HCTZ 40-25 MG PO TABS: 1.0000 | ORAL_TABLET | Freq: Every morning | ORAL | Status: DC

## 2012-06-03 MED ORDER — SODIUM CHLORIDE 0.9 % IV SOLN
1000.0000 mL | INTRAVENOUS | Status: DC
  Administered 2012-06-03: 1000 mL via INTRAVENOUS

## 2012-06-03 MED ORDER — HEPARIN (PORCINE) IN NACL 100-0.45 UNIT/ML-% IJ SOLN
1750.0000 [IU]/h | INTRAMUSCULAR | Status: DC
  Administered 2012-06-03: 1500 [IU]/h via INTRAVENOUS
  Administered 2012-06-03: 1750 [IU]/h via INTRAVENOUS
  Filled 2012-06-03 (×3): qty 250

## 2012-06-03 MED ORDER — HEPARIN BOLUS VIA INFUSION
5000.0000 [IU] | Freq: Once | INTRAVENOUS | Status: AC
  Administered 2012-06-03: 5000 [IU] via INTRAVENOUS

## 2012-06-03 MED ORDER — WARFARIN - PHARMACIST DOSING INPATIENT: Freq: Every day | Status: DC

## 2012-06-03 MED ORDER — HEPARIN BOLUS VIA INFUSION
2000.0000 [IU] | Freq: Once | INTRAVENOUS | Status: AC
  Administered 2012-06-03: 2000 [IU] via INTRAVENOUS
  Filled 2012-06-03: qty 2000

## 2012-06-03 MED ORDER — HYDROCHLOROTHIAZIDE 25 MG PO TABS
25.0000 mg | ORAL_TABLET | Freq: Every day | ORAL | Status: DC
  Administered 2012-06-03 – 2012-06-09 (×7): 25 mg via ORAL
  Filled 2012-06-03 (×7): qty 1

## 2012-06-03 MED ORDER — SODIUM CHLORIDE 0.9 % IV SOLN: 1000.0000 mL | Freq: Once | INTRAVENOUS | Status: DC

## 2012-06-03 MED ORDER — SODIUM CHLORIDE 0.9 % IV SOLN
1000.0000 mL | Freq: Once | INTRAVENOUS | Status: AC
  Administered 2012-06-03: 1000 mL via INTRAVENOUS

## 2012-06-03 MED ORDER — IRBESARTAN 300 MG PO TABS
300.0000 mg | ORAL_TABLET | Freq: Every day | ORAL | Status: DC
  Administered 2012-06-03 – 2012-06-09 (×7): 300 mg via ORAL
  Filled 2012-06-03 (×7): qty 1

## 2012-06-03 MED ORDER — ALBUTEROL SULFATE (5 MG/ML) 0.5% IN NEBU: 2.5000 mg | INHALATION_SOLUTION | Freq: Four times a day (QID) | RESPIRATORY_TRACT | Status: DC | PRN

## 2012-06-03 MED ORDER — POTASSIUM CHLORIDE CRYS ER 20 MEQ PO TBCR
40.0000 meq | EXTENDED_RELEASE_TABLET | ORAL | Status: AC
  Administered 2012-06-03 (×3): 40 meq via ORAL
  Filled 2012-06-03 (×3): qty 2

## 2012-06-03 MED ORDER — POTASSIUM CHLORIDE CRYS ER 20 MEQ PO TBCR
40.0000 meq | EXTENDED_RELEASE_TABLET | Freq: Once | ORAL | Status: AC
  Administered 2012-06-03: 40 meq via ORAL
  Filled 2012-06-03: qty 2

## 2012-06-03 MED ORDER — PANTOPRAZOLE SODIUM 40 MG PO TBEC
40.0000 mg | DELAYED_RELEASE_TABLET | Freq: Every day | ORAL | Status: DC
  Administered 2012-06-04 – 2012-06-08 (×5): 40 mg via ORAL
  Filled 2012-06-03 (×6): qty 1

## 2012-06-03 NOTE — Plan of Care (Signed)
Problem: Phase I Progression Outcomes Goal: Hemodynamically stable Outcome: Progressing Pt. On a heparin gtt

## 2012-06-03 NOTE — Progress Notes (Addendum)
Pt. Arrived to the floor from ED at 1315. Pt has O2 via [place]. O2 sat is 98% on 6L. She can speak in full sentences but becomes  SOB. Abdomen obese BS present. Pt. States LBM_10-5. She c/o headache rating it a 5/10. Medicated with Tylenol 650 mg. Family in visiting . Pt has chronic neck pain, she has Degenerative disc disease and arthritis also in her neck. Heparin gtt infusing at 15cc/hr. Lt leg elevated on pillow. It appears edematous, non-pitting compared to the Rt. Pt is alert and oriented.Pt. Given the coumadin book to read, she states she has been on coumadin before. She is watching a football game and states she will watch the coumadin after the game is over. Pt. Given hand out information on the flu vaccine and pneumonia vaccine.

## 2012-06-03 NOTE — ED Notes (Addendum)
Pt o2 sat 89% on 4L [place].  Increased to 6L and o2 sat increased to 95%.  Informed MD.  Will have attending hospitalist reassess when she gets here or when pt gets bed

## 2012-06-03 NOTE — Plan of Care (Signed)
Problem: Phase I Progression Outcomes Goal: Tolerating diet Outcome: Progressing Pt was thirsty not hungry

## 2012-06-03 NOTE — ED Notes (Signed)
MD at bedside. 

## 2012-06-03 NOTE — Progress Notes (Signed)
Left: DVT noted in the popliteal and posterior tibial veins.  No evidence of superficial thrombosis.  No Baker's cyst.  Right:  No evidence of DVT, superficial thrombosis, or Baker's cyst.

## 2012-06-03 NOTE — ED Provider Notes (Signed)
History     CSN: [id number]  Arrival date & time 06/03/12  4132   First MD Initiated Contact with Patient 06/03/12 [phone]      Chief Complaint  Patient presents with  . Shortness of Breath  . Leg Pain     The history is provided by the patient.   patient presents the emergency department with increasing shortness of breath and generalized weakness over the past 3 days.  She's also had approximately 3 days of left leg pain.  She has a history of DVT before in the past.  She's no longer on anticoagulation.  She has no history of cancer.  She denies recent long travel or surgery.  Denies chest pain.  She does report shortness of breath is significantly worsened since last night.  She presented emergency apartment with O2 saturations of 86% was placed on oxygen.  She denies fevers or chills.  She's had no productive cough.  Past Medical History  Diagnosis Date  . Hypertension   . Cellulitis     Past Surgical History  Procedure Date  . Pelvic laparoscopy 2012    BORDERLINE OVARIAN TUMOR SEROUS RIGHT  . Cataract extraction   . Foot surgery   . Varicose veins   . Oophorectomy 2012    BSO  ENDOSALPINGIOSIS  . Abdominal surgery 2012    UMBILICAL HERNIA    Family History  Problem Relation Age of Onset  . Heart disease Mother   . Diabetes Brother   . Hypertension Brother   . Breast cancer Daughter   . Hypertension Daughter   . Cancer Daughter     breast and liver    History  Substance Use Topics  . Smoking status: Former Smoker -- 0.5 packs/day    Types: Cigarettes  . Smokeless tobacco: Not on file  . Alcohol Use: No    OB History    Grav Para Term Preterm Abortions TAB SAB Ect Mult Living   4 4        4       Review of Systems  Respiratory: Positive for shortness of breath.   All other systems reviewed and are negative.    Allergies  Morphine and related  Home Medications   Current Outpatient Rx  Name Route Sig Dispense Refill  . AMLODIPINE BESYLATE 5 MG  PO TABS Oral Take 5 mg by mouth every morning.     . ASPIRIN BUFFERED 500 MG PO TABS Oral Take 500 mg by mouth daily as needed. For pain    . CYCLOBENZAPRINE HCL 5 MG PO TABS Oral Take 5 mg by mouth 2 (two) times daily as needed. For spasms    . OLMESARTAN MEDOXOMIL-HCTZ 40-25 MG PO TABS Oral Take 1 tablet by mouth every morning.     . VENLAFAXINE HCL ER 75 MG PO CP24 Oral Take 225 mg by mouth every morning.       BP 143/95  Pulse 95  Temp 98.4 F (36.9 C) (Oral)  Resp 26  SpO2 92%  Physical Exam  Nursing note and vitals reviewed. Constitutional: She is oriented to person, place, and time. She appears well-developed and well-nourished. No distress.  HENT:  Head: Normocephalic and atraumatic.  Eyes: EOM are normal.  Neck: Normal range of motion.  Cardiovascular: Normal rate, regular rhythm and normal heart sounds.   Pulmonary/Chest: Effort normal and breath sounds normal.       tachypneic  Abdominal: Soft. She exhibits no distension. There is no tenderness.  Musculoskeletal:  Normal range of motion.       Tenderness and mild swelling of left lower extremity as compared to right.  Tenderness is primarily in the left calf.  No erythema.  Normal pulses in the left foot the  Neurological: She is alert and oriented to person, place, and time.  Skin: Skin is warm and dry.  Psychiatric: She has a normal mood and affect. Judgment normal.    ED Course  Procedures (including critical care time)  CRITICAL CARE Performed by: Lyanne Co Total critical care time: 30 Critical care time was exclusive of separately billable procedures and treating other patients. Critical care was necessary to treat or prevent imminent or life-threatening deterioration. Critical care was time spent personally by me on the following activities: development of treatment plan with patient and/or surrogate as well as nursing, discussions with consultants, evaluation of patient's response to treatment,  examination of patient, obtaining history from patient or surrogate, ordering and performing treatments and interventions, ordering and review of laboratory studies, ordering and review of radiographic studies, pulse oximetry and re-evaluation of patient's condition.   Date: 06/03/2012  Rate: 93  Rhythm: normal sinus rhythm  QRS Axis: normal  Intervals: normal  ST/T Wave abnormalities: normal  Conduction Disutrbances: none  Narrative Interpretation:   Old EKG Reviewed: No significant changes noted     Labs Reviewed  GLUCOSE, CAPILLARY - Abnormal; Notable for the following:    Glucose-Capillary 150 (*)     All other components within normal limits  CBC WITH DIFFERENTIAL - Abnormal; Notable for the following:    WBC 11.6 (*)     Hemoglobin 15.9 (*)     HCT 46.2 (*)     Neutrophils Relative 81 (*)     Neutro Abs 9.4 (*)     Lymphocytes Relative 8 (*)     Monocytes Absolute 1.2 (*)     All other components within normal limits  COMPREHENSIVE METABOLIC PANEL - Abnormal; Notable for the following:    Potassium 3.1 (*)     Glucose, Bld 153 (*)     Albumin 3.3 (*)     GFR calc non Af Amer 84 (*)     All other components within normal limits  URINALYSIS, ROUTINE W REFLEX MICROSCOPIC - Abnormal; Notable for the following:    Color, Urine AMBER (*)  BIOCHEMICALS MAY BE AFFECTED BY COLOR   APPearance CLOUDY (*)     Bilirubin Urine SMALL (*)     Ketones, ur TRACE (*)     Protein, ur 30 (*)     All other components within normal limits  TROPONIN I  PRO B NATRIURETIC PEPTIDE  PROTIME-INR  APTT  URINE MICROSCOPIC-ADD ON  HEPARIN LEVEL (UNFRACTIONATED)   Dg Chest 2 View  06/03/2012  *RADIOLOGY REPORT*  Clinical Data: Shortness of breath.  Leg pain.  History of hypertension, bronchitis, obesity.  CHEST - 2 VIEW  Comparison: Chest x-ray 12/02/2010  Findings: Study is compromised by patient body habitus.  The heart size appears enlarged but is probably accentuated by technique. Suspect  left lower lobe density which obscures the hemidiaphragm. Left pleural effusion is suspected.  IMPRESSION:  1.  Technically limited exam. 2.  Cardiomegaly suspected. 3.  Probable left lower lobe infiltrate and left pleural effusion.   Original Report Authenticated By: Patterson Hammersmith, M.D.    Ct Angio Chest W/cm &/or Wo Cm  06/03/2012  *RADIOLOGY REPORT*  Clinical Data: Deep venous thrombosis.  Abnormal chest radiography. Assess for pulmonary  emboli.  CT ANGIOGRAPHY CHEST  Technique:  Multidetector CT imaging of the chest using the standard protocol during bolus administration of intravenous contrast. Multiplanar reconstructed images including MIPs were obtained and reviewed to evaluate the vascular anatomy.  Contrast: OMNIPAQUE IOHEXOL 350 MG/ML SOLN  Comparison: Chest CT same day  Findings: Pulmonary arterial opacification is satisfactory.  There is extensive embolic disease in the right pulmonary arterial tree. There may be a few small emboli in the left lower lobe branches as well.  The pulmonary parenchyma shows minimal atelectasis in the left lower lobe. There is an 8 mm density in the right middle lobe region of uncertain significance.  No effusions.  There is ordinary atherosclerosis of the aorta.  There is coronary artery atherosclerotic disease.  Scans in the upper abdomen do not show any significant finding.  IMPRESSION: Examination is positive for pulmonary emboli with a large clot burden in the right pulmonary arterial tree.  Critical Value/emergent results were called by telephone at the time of interpretation on 06/03/2012 at 1100 hours to Dr. Patria Mane, who verbally acknowledged these results.   Original Report Authenticated By: Thomasenia Sales, M.D.     I personally reviewed the imaging tests through PACS system Discussed with Radiology I reviewed available ER/hospitalization records thought the EMR   1. Pulmonary embolism       MDM  Patient be started on a heparin drip for her  deep vein thromboses as well as her large clot burden pulmonary embolism.  Her O2 sats on arrival were 86%.  Her heparin was ordered early in her care.  The patient understands the reasoning for admission.        Lyanne Co, MD 06/03/12 1121

## 2012-06-03 NOTE — Plan of Care (Signed)
Problem: Phase I Progression Outcomes Goal: Dyspnea controlled at rest (PE) Outcome: Progressing Pt. On 6 L O2 via [place]. Sats are 98%

## 2012-06-03 NOTE — Plan of Care (Signed)
Problem: Phase I Progression Outcomes Goal: Pain controlled with appropriate interventions Outcome: Progressing Pt c/o headache

## 2012-06-03 NOTE — ED Notes (Signed)
VZD:[id number]<FI> Expected date:06/03/12<BR> Expected time: 8:06 AM<BR> Means of arrival:Ambulance<BR> Comments:<BR> [place]

## 2012-06-03 NOTE — ED Notes (Signed)
Heparin checked with Rosamaria Lints, RN

## 2012-06-03 NOTE — Progress Notes (Signed)
ANTICOAGULATION CONSULT NOTE - Initial Consult  Pharmacy Consult for Warfarin Indication: PE/DVT  Allergies  Allergen Reactions  . Morphine And Related Nausea And Vomiting    Patient Measurements: Height: 5\' 6"  (167.6 cm) Weight: 293 lb (132.904 kg) IBW/kg (Calculated) : 59.3    Vital Signs: Temp: 98.5 F (36.9 C) (10/06 1318) Temp src: Oral (10/06 1133) BP: 128/87 mmHg (10/06 1405) Pulse Rate: 93  (10/06 1318)  Labs:  Basename 06/03/12 0900  HGB 15.9*  HCT 46.2*  PLT 227  APTT 36  LABPROT 13.8  INR 1.07  HEPARINUNFRC --  CREATININE 0.67  CKTOTAL --  CKMB --  TROPONINI <0.30    Estimated Creatinine Clearance: 85.1 ml/min (by C-G formula based on Cr of 0.67).   Medical History: Past Medical History  Diagnosis Date  . Hypertension   . Cellulitis     Medications:  Scheduled:    . sodium chloride  1,000 mL Intravenous Once  . amLODipine  5 mg Oral q morning - 10a  . docusate sodium  100 mg Oral BID  . heparin  5,000 Units Intravenous Once  . hydrochlorothiazide  25 mg Oral Daily   And  . irbesartan  300 mg Oral Daily  .  morphine injection  6 mg Intravenous Once  . pantoprazole  40 mg Oral Q0600  . potassium chloride  40 mEq Oral Once  . potassium chloride  40 mEq Oral Q2H  . senna  1 tablet Oral BID  . venlafaxine XR  225 mg Oral q morning - 10a  . DISCONTD: sodium chloride  1,000 mL Intravenous Once  . DISCONTD: olmesartan-hydrochlorothiazide  1 tablet Oral q morning - 10a   Infusions:    . sodium chloride    . heparin 1,500 Units/hr (06/03/12 1208)  . DISCONTD: sodium chloride 1,000 mL (06/03/12 1241)   PRN: acetaminophen, acetaminophen, albuterol, iohexol, zolpidem  Assessment:  77yoF started on Heparin for DVT/PE and warfarin to start tonight.  Goal of Therapy:  INR 2-3   Plan:  . Warfarin  7.5mg  po x1 tonight . Will check daily PT/INR . Coumadin book/video  [name] 06/03/2012,4:14 PM

## 2012-06-03 NOTE — H&P (Signed)
Hospital Admission Note Date: 06/03/2012  Patient name: Brittany [name] Medical record number: [id number] Date of birth: 10-Feb-1935 Age: 77 y.o. Gender: female PCP: Lillia Mountain, MD  Attending physician: Altha Harm, MD  Chief Complaint: Shortness of breath  History of Present Illness: This is an obese 77 year old female who recently took a long car trip to the [place] area and back which constitutes 8 hours of immobilized state. Patient states that approximately 3 or 4 days ago she noticed that she was having shortness of breath which got progressively worse. This prompted her coming to the emergency room where she was found to have pulmonary embolus with large clot burden in the right pulmonary arterial tree. The patient also was noted to have swelling in her left lower extremity and was also found to have an acute DVT in the left lower extremity.   Scheduled Meds:   . sodium chloride  1,000 mL Intravenous Once   Followed by  . sodium chloride  1,000 mL Intravenous Once  . heparin  5,000 Units Intravenous Once  .  morphine injection  6 mg Intravenous Once  . potassium chloride  40 mEq Oral Once   Continuous Infusions:   . sodium chloride 1,000 mL (06/03/12 1241)  . heparin 1,500 Units/hr (06/03/12 1208)   PRN Meds:.iohexol Allergies: Morphine and related Past Medical History  Diagnosis Date  . Hypertension   . Cellulitis    Past Surgical History  Procedure Date  . Pelvic laparoscopy 2012    BORDERLINE OVARIAN TUMOR SEROUS RIGHT  . Cataract extraction   . Foot surgery   . Varicose veins   . Oophorectomy 2012    BSO  ENDOSALPINGIOSIS  . Abdominal surgery 2012    UMBILICAL HERNIA   Family History  Problem Relation Age of Onset  . Heart disease Mother   . Diabetes Brother   . Hypertension Brother   . Breast cancer Daughter   . Hypertension Daughter   . Cancer Daughter     breast and liver   History   Social History  . Marital Status: Widowed   Spouse Name: N/A    Number of Children: N/A  . Years of Education: N/A   Occupational History  . Not on file.   Social History Main Topics  . Smoking status: Former Smoker -- 0.5 packs/day    Types: Cigarettes  . Smokeless tobacco: Not on file  . Alcohol Use: No  . Drug Use: No  . Sexually Active:    Other Topics Concern  . Not on file   Social History Narrative  . No narrative on file   Review of Systems: A comprehensive review of systems was negative except as noted in the history of present illness. Physical Exam: No intake or output data in the 24 hours ending 06/03/12 1242 General: Alert, awake, oriented x3, in no acute distress.  HEENT: [place]/AT PEERL, EOMI Heart: S1 and S2 normal. Patient's sinus tachycardia on telemetry monitor. No murmurs, rubs, gallops, PMI non-displaced, no heaves or thrills on palpation.  Lungs: Clear to auscultation, no wheezing or rhonchi noted.  Abdomen: Obese soft, nontender, nondistended, positive bowel sounds.  Neuro: No focal neurological deficits noted. Strength functional in bilateral upper and lower extremities. Musculoskeletal: No warm swelling or erythema around joints, no spinal tenderness noted.   Lab results:  Basename 06/03/12 0900  NA 137  K 3.1*  CL 97  CO2 30  GLUCOSE 153*  BUN 16  CREATININE 0.67  CALCIUM 9.2  MG --  PHOS --    Basename 06/03/12 0900  AST 12  ALT 11  ALKPHOS 92  BILITOT 0.8  PROT 7.1  ALBUMIN 3.3*   No results found for this basename: LIPASE:2,AMYLASE:2 in the last 72 hours  Basename 06/03/12 0900  WBC 11.6*  NEUTROABS 9.4*  HGB 15.9*  HCT 46.2*  MCV 91.8  PLT 227    Basename 06/03/12 0900  CKTOTAL --  CKMB --  CKMBINDEX --  TROPONINI <0.30   No components found with this basename: POCBNP:3 No results found for this basename: DDIMER:2 in the last 72 hours No results found for this basename: HGBA1C:2 in the last 72 hours No results found for this basename:  CHOL:2,HDL:2,LDLCALC:2,TRIG:2,CHOLHDL:2,LDLDIRECT:2 in the last 72 hours No results found for this basename: TSH,T4TOTAL,FREET3,T3FREE,THYROIDAB in the last 72 hours No results found for this basename: VITAMINB12:2,FOLATE:2,FERRITIN:2,TIBC:2,IRON:2,RETICCTPCT:2 in the last 72 hours Imaging results:  Dg Chest 2 View  06/03/2012  *RADIOLOGY REPORT*  Clinical Data: Shortness of breath.  Leg pain.  History of hypertension, bronchitis, obesity.  CHEST - 2 VIEW  Comparison: Chest x-ray 12/02/2010  Findings: Study is compromised by patient body habitus.  The heart size appears enlarged but is probably accentuated by technique. Suspect left lower lobe density which obscures the hemidiaphragm. Left pleural effusion is suspected.  IMPRESSION:  1.  Technically limited exam. 2.  Cardiomegaly suspected. 3.  Probable left lower lobe infiltrate and left pleural effusion.   Original Report Authenticated By: Patterson Hammersmith, M.D.    Ct Angio Chest W/cm &/or Wo Cm  06/03/2012  *RADIOLOGY REPORT*  Clinical Data: Deep venous thrombosis.  Abnormal chest radiography. Assess for pulmonary emboli.  CT ANGIOGRAPHY CHEST  Technique:  Multidetector CT imaging of the chest using the standard protocol during bolus administration of intravenous contrast. Multiplanar reconstructed images including MIPs were obtained and reviewed to evaluate the vascular anatomy.  Contrast: OMNIPAQUE IOHEXOL 350 MG/ML SOLN  Comparison: Chest CT same day  Findings: Pulmonary arterial opacification is satisfactory.  There is extensive embolic disease in the right pulmonary arterial tree. There may be a few small emboli in the left lower lobe branches as well.  The pulmonary parenchyma shows minimal atelectasis in the left lower lobe. There is an 8 mm density in the right middle lobe region of uncertain significance.  No effusions.  There is ordinary atherosclerosis of the aorta.  There is coronary artery atherosclerotic disease.  Scans in the upper  abdomen do not show any significant finding.  IMPRESSION: Examination is positive for pulmonary emboli with a large clot burden in the right pulmonary arterial tree.  Critical Value/emergent results were called by telephone at the time of interpretation on 06/03/2012 at 1100 hours to Dr. Patria Mane, who verbally acknowledged these results.   Original Report Authenticated By: Thomasenia Sales, M.D.    Other results: EKG:  sinus tachycardia.   Patient Active Hospital Problem List: Acute pulmonary embolus (06/03/2012)   Assessment: Patient presents with acute pulmonary embolus. Her risk factor for this includes immobilization for approximately 8 hours secondary to extended travel back car. 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 This places her at potential risk for an underlying malignancy. Thus I will check a CEA.   Plan: The patient's been started on IV heparin which I will continue. I will also generated pharmacy consult for Coumadin management.  DVT of lower extremity (deep venous thrombosis) (06/03/2012)  Assessment: See above     Hypokalemia (06/03/2012)   Assessment: Replaced orally     Care to be assumed by Dr. Valentina Lucks starting on 06/04/2012.   Catrinia Racicot A. 06/03/2012, 12:42 PM

## 2012-06-03 NOTE — ED Notes (Signed)
Pt began having sob yesterday evening that has worsened over night.  Pt also began having L calf pain yesterday.  Pt states that she has been sitting down for long periods of time recently because she has not felt steady.  Pt states she also has been having a thirst that she can't quench no matter how much water she drinks, pt states this has been going on for several days.  Pt states she also has bilateral ear pain.  Lungs clear, but RA sat 86%.

## 2012-06-03 NOTE — Progress Notes (Addendum)
ANTICOAGULATION CONSULT NOTE - Initial Consult  Pharmacy Consult for IV Heparin Indication: DVT/PE  Allergies  Allergen Reactions  . Morphine And Related Nausea And Vomiting    Patient Measurements:   Patient stated height and weight: 136 kg and 5'6" Heparin Dosing Weight: 93 kg  Vital Signs: Temp: 98.4 F (36.9 C) (10/06 0838) Temp src: Oral (10/06 0838) BP: 143/95 mmHg (10/06 0838) Pulse Rate: 90  (10/06 0838)  Labs:  Basename 06/03/12 0900  HGB 15.9*  HCT 46.2*  PLT 227  APTT --  LABPROT --  INR --  HEPARINUNFRC --  CREATININE 0.67  CKTOTAL --  CKMB --  TROPONINI <0.30    Medical History: Past Medical History  Diagnosis Date  . Hypertension   . Cellulitis     Assessment: 65 YOF presents with SOB and L calf pain starting yesterday.  Left leg positive for DVT.  CXR pending.  Starting IV heparin for treatment of new VTE.  SCr WNL.  Baseline CBC, PT/INR, aPTT WNL.  Goal of Therapy:  Heparin level 0.3-0.7 units/ml Monitor platelets by anticoagulation protocol: Yes   Plan:  Heparin 5000 unit IV bolus x 1 then start infusion at 1500 units/hr (15 mL/hr).  Check heparin level in 8 hours.   Daily CBC and heparin levels.  Clance Boll 06/03/2012,10:11 AM  06/03/12 2140  HL=0.15 (goal 0.3-0.7) on 1500 units/hr. No bleeding noted. Will rebolus with 2000 units IV x 1 then increase rate to 1750 units/hr. WIll f/u am HL (7 hr HL)/CBC. [name] 06/03/2012

## 2012-06-04 DIAGNOSIS — I10 Essential (primary) hypertension: Secondary | ICD-10-CM

## 2012-06-04 LAB — HEPARIN LEVEL (UNFRACTIONATED): Heparin Unfractionated: 0.28 IU/mL — ABNORMAL LOW (ref 0.30–0.70)

## 2012-06-04 LAB — BASIC METABOLIC PANEL
BUN: 15 mg/dL (ref 6–23)
Calcium: 8.9 mg/dL (ref 8.4–10.5)
Creatinine, Ser: 0.7 mg/dL (ref 0.50–1.10)
GFR calc non Af Amer: 83 mL/min — ABNORMAL LOW (ref >90)
Glucose, Bld: 128 mg/dL — ABNORMAL HIGH (ref 70–99)
Sodium: 139 mEq/L (ref 135–145)

## 2012-06-04 LAB — CBC
HCT: 43.1 % (ref 36.0–46.0)
MCHC: 33.2 g/dL (ref 30.0–36.0)
MCV: 94.1 fL (ref 78.0–100.0)
RDW: 13 % (ref 11.5–15.5)

## 2012-06-04 LAB — PROTEIN C ACTIVITY: Protein C Activity: 169 % — ABNORMAL HIGH (ref 75–133)

## 2012-06-04 LAB — BETA-2-GLYCOPROTEIN I ABS, IGG/M/A: Beta-2 Glyco I IgG: 0 G Units (ref <20)

## 2012-06-04 LAB — CARDIOLIPIN ANTIBODIES, IGG, IGM, IGA: Anticardiolipin IgA: 16 APL U/mL (ref <22)

## 2012-06-04 LAB — LUPUS ANTICOAGULANT PANEL
DRVVT: 45.2 secs — ABNORMAL HIGH (ref <42.9)
Lupus Anticoagulant: DETECTED — AB

## 2012-06-04 MED ORDER — PNEUMOCOCCAL VAC POLYVALENT 25 MCG/0.5ML IJ INJ
0.5000 mL | INJECTION | INTRAMUSCULAR | Status: AC
  Filled 2012-06-04: qty 0.5

## 2012-06-04 MED ORDER — ACETAMINOPHEN 650 MG RE SUPP: 650.0000 mg | RECTAL | Status: DC | PRN

## 2012-06-04 MED ORDER — HEPARIN (PORCINE) IN NACL 100-0.45 UNIT/ML-% IJ SOLN
1900.0000 [IU]/h | INTRAMUSCULAR | Status: DC
  Filled 2012-06-04 (×3): qty 250

## 2012-06-04 MED ORDER — ACETAMINOPHEN 325 MG PO TABS
650.0000 mg | ORAL_TABLET | Freq: Four times a day (QID) | ORAL | Status: DC | PRN
  Administered 2012-06-05 – 2012-06-09 (×8): 650 mg via ORAL
  Filled 2012-06-04 (×8): qty 2

## 2012-06-04 MED ORDER — WARFARIN SODIUM 7.5 MG PO TABS
7.5000 mg | ORAL_TABLET | Freq: Once | ORAL | Status: AC
  Administered 2012-06-04: 7.5 mg via ORAL
  Filled 2012-06-04: qty 1

## 2012-06-04 MED ORDER — ACETAMINOPHEN 325 MG PO TABS
650.0000 mg | ORAL_TABLET | ORAL | Status: DC | PRN
  Administered 2012-06-04: 650 mg via ORAL
  Filled 2012-06-04: qty 2

## 2012-06-04 MED ORDER — HEPARIN BOLUS VIA INFUSION
1500.0000 [IU] | Freq: Once | INTRAVENOUS | Status: AC
  Administered 2012-06-04: 1500 [IU] via INTRAVENOUS
  Filled 2012-06-04: qty 1500

## 2012-06-04 MED ORDER — INFLUENZA VIRUS VACC SPLIT PF IM SUSP
0.5000 mL | INTRAMUSCULAR | Status: AC
  Administered 2012-06-05: 0.5 mL via INTRAMUSCULAR
  Filled 2012-06-04: qty 0.5

## 2012-06-04 MED ORDER — KETOROLAC TROMETHAMINE 30 MG/ML IJ SOLN
30.0000 mg | INTRAMUSCULAR | Status: DC | PRN
  Administered 2012-06-04 – 2012-06-06 (×8): 30 mg via INTRAVENOUS
  Filled 2012-06-04 (×9): qty 1

## 2012-06-04 MED ORDER — HEPARIN (PORCINE) IN NACL 100-0.45 UNIT/ML-% IJ SOLN
2100.0000 [IU]/h | INTRAMUSCULAR | Status: DC
  Administered 2012-06-04 – 2012-06-07 (×5): 2100 [IU]/h via INTRAVENOUS
  Filled 2012-06-04 (×11): qty 250

## 2012-06-04 NOTE — Progress Notes (Signed)
ANTICOAGULATION CONSULT NOTE - Follow-Up Consult  Pharmacy Consult for Heparin/Warfarin Indication: PE/DVT  Allergies  Allergen Reactions  . Morphine And Related Nausea And Vomiting    Patient Measurements: Height: 5\' 6"  (167.6 cm) Weight: 293 lb (132.904 kg) IBW/kg (Calculated) : 59.3    Vital Signs: Temp: 97.9 F (36.6 C) (10/07 0458) Temp src: Oral (10/07 0458) BP: 121/77 mmHg (10/07 0458) Pulse Rate: 78  (10/07 0458)  Labs:  Basename 06/04/12 1230 06/04/12 0340 06/03/12 1958 06/03/12 0900  HGB -- 14.3 -- 15.9*  HCT -- 43.1 -- 46.2*  PLT -- 209 -- 227  APTT -- -- -- 36  LABPROT -- -- -- 13.8  INR -- -- -- 1.07  HEPARINUNFRC 0.22* 0.28* 0.15* --  CREATININE -- -- -- 0.67  CKTOTAL -- -- -- --  CKMB -- -- -- --  TROPONINI -- -- -- <0.30    Estimated Creatinine Clearance: 85.1 ml/min (by C-G formula based on Cr of 0.67).   Medical History: Past Medical History  Diagnosis Date  . Hypertension   . Cellulitis   . Cataract   . Arthritis   . Clotting disorder     Previous blood clot in leg  . Depression     Medications:  Scheduled:     . amLODipine  5 mg Oral q morning - 10a  . coumadin book   Does not apply Once  . docusate sodium  100 mg Oral BID  . heparin  2,000 Units Intravenous Once  . hydrochlorothiazide  25 mg Oral Daily   And  . irbesartan  300 mg Oral Daily  . pantoprazole  40 mg Oral Q0600  . potassium chloride  40 mEq Oral Q2H  . senna  1 tablet Oral BID  . venlafaxine XR  225 mg Oral q morning - 10a  . warfarin  7.5 mg Oral ONCE-1800  . warfarin   Does not apply Once  . Warfarin - Pharmacist Dosing Inpatient   Does not apply q1800  . DISCONTD: sodium chloride  1,000 mL Intravenous Once  . DISCONTD: olmesartan-hydrochlorothiazide  1 tablet Oral q morning - 10a   Infusions:     . sodium chloride    . heparin 1,900 Units/hr (06/04/12 1610)  . DISCONTD: sodium chloride 1,000 mL (06/03/12 1241)  . DISCONTD: heparin 1,750 Units/hr  (06/03/12 2245)   PRN: acetaminophen, acetaminophen, albuterol, zolpidem, DISCONTD: acetaminophen, DISCONTD: acetaminophen  Assessment:  Brittany [name] started on Heparin/Warfarin for DVT/PE.  Day 2/5 of required warfarin/heparin overlap  Heparin level subtx  No INR today  CBC stable  Goal of Therapy:  INR 2-3 HL 0.3-0.7   Plan:  1. Heparin IV 1500 unit bolus x 1 now 2. Increase heparin rate to 2100 units/hr (21 mL/hr) 3. Coumadin 7.5 mg x 1 4. Heparin level in 8 hours 5. Daily Heparin level, CBC 6. Daily PT/INR 7. Coumadin Education   [employer], [address].D. Clinical Oncology Pharmacist  Pager # [phone]  06/04/2012,1:07 PM

## 2012-06-04 NOTE — Progress Notes (Signed)
PHARMACY BRIEF NOTE - Heparin  Pharmacy Consult for:  Heparin Indication: PE / DVT  Assessment: With heparin infusing at 2100 units/hr, the level drawn at 21:33 today is reported as 0.36 units/ml.  This level is within the therapeutic range.   Goal of Therapy:   Heparin level 0.3-0.7 units/ml  Monitor platelets by anticoagulation protocol: Yes   Plan:   Continue heparin at the current rate overnight.  Next level with morning labs on 06/05/12.  Polo Riley R.Ph. 06/04/2012 11:21 PM

## 2012-06-04 NOTE — Progress Notes (Signed)
  Echocardiogram 2D Echocardiogram has been performed (without Definity) per Dr. Elisabeth Pigeon.   Brittany [name] 06/04/2012, 10:55 AM

## 2012-06-04 NOTE — Progress Notes (Addendum)
[facility] PROGRESS NOTE  ANALISA SLEDD RUE:[id number] DOB: 02/24/1935 DOA: 06/03/2012 PCP: Lillia Mountain, MD  Brief narrative: 77 year old female with history of HTN, borderline ovarian tumor removed about 1 year ago) who was admitted 06/03/2012 for acute onset shortness of breath status post long trip to [place]. Patient was found to have acute pulmonary embolism and DVT.  Assessment/Plan:  Principal Problem:  *Acute pulmonary embolus and DVT - continue heparin and coumadin per pharmacy  Active Problems:  Hypertension - continue current BP meds   Hypokalemia - repleted - follow up BMP today  Code Status: full code Family Communication: at bedside Disposition Plan: home when stable; need PT evaluation  Manson Passey, MD  [facility] Pager [phone]  If 7PM-7AM, please contact night-coverage [email or web address] Password [email or web address] 06/04/2012, 1:19 PM   LOS: 1 day   HPI/Subjective: No acute events overnight.  Objective: Filed Vitals:   06/03/12 1318 06/03/12 1405 06/03/12 2230 06/04/12 0458  BP: 128/87 128/87 125/62 121/77  Pulse: 93  88 78  Temp: 98.5 F (36.9 C)  98.3 F (36.8 C) 97.9 F (36.6 C)  TempSrc:   Oral Oral  Resp: 22  20 20   Height: 5\' 6"  (1.676 m)     Weight: 132.904 kg (293 lb)     SpO2: 98%  93% 92%    Intake/Output Summary (Last 24 hours) at 06/04/12 1319 Last data filed at 06/04/12 0636  Gross per 24 hour  Intake    944 ml  Output    550 ml  Net    394 ml    Exam:   General:  Pt is alert, follows commands appropriately, not in acute distress  Cardiovascular: Regular rate and rhythm, S1/S2, no murmurs, no rubs, no gallops  Respiratory: Clear to auscultation bilaterally, no wheezing, no crackles, no rhonchi  Abdomen: Soft, non tender, non distended, bowel sounds present, no guarding  Extremities: No edema, pulses DP and PT palpable bilaterally  Neuro: Grossly nonfocal  Data Reviewed: Basic Metabolic Panel:  Lab 06/03/12 6213  NA  137  K 3.1*  CL 97  CO2 30  GLUCOSE 153*  BUN 16  CREATININE 0.67  CALCIUM 9.2   Liver Function Tests:  Lab 06/03/12 0900  AST 12  ALT 11  ALKPHOS 92  BILITOT 0.8  PROT 7.1  ALBUMIN 3.3*   CBC:  Lab 06/04/12 0340 06/03/12 0900  WBC 11.1* 11.6*  HGB 14.3 15.9*  HCT 43.1 46.2*  MCV 94.1 91.8  PLT 209 227   Cardiac Enzymes:  Lab 06/03/12 0900  CKTOTAL --  CKMB --  TROPONINI <0.30   CBG:  Lab 06/03/12 0839  GLUCAP 150*    Studies: Dg Chest 2 View 06/03/2012  IMPRESSION:  1.  Technically limited exam. 2.  Cardiomegaly suspected. 3.  Probable left lower lobe infiltrate and left pleural effusion.     Ct Angio Chest W/cm &/or Wo Cm 06/03/2012  * IMPRESSION: Examination is positive for pulmonary emboli with a large clot burden in the right pulmonary arterial tree.  Critical Value/emergent results were called by telephone at the time of interpretation on 06/03/2012 at 1100 hours to Dr. Patria Mane, who verbally acknowledged these results.     Scheduled Meds:   . amLODipine  5 mg Oral q morning - 10a  . coumadin book   Does not apply Once  . docusate sodium  100 mg Oral BID  . hydrochlorothiazide  25 mg Oral Daily  . irbesartan  300 mg Oral Daily  .  pantoprazole  40 mg Oral Q0600  . potassium chloride  40 mEq Oral Q2H  . senna  1 tablet Oral BID  . venlafaxine XR  225 mg Oral q morning - 10a  . warfarin  7.5 mg Oral ONCE-1800   Continuous Infusions:   . heparin

## 2012-06-04 NOTE — Progress Notes (Signed)
ANTICOAGULATION CONSULT NOTE - Follow Up Consult  Pharmacy Consult for heparin Indication: pulmonary embolus and DVT  Allergies  Allergen Reactions  . Morphine And Related Nausea And Vomiting    Patient Measurements: Height: 5\' 6"  (167.6 cm) Weight: 293 lb (132.904 kg) IBW/kg (Calculated) : 59.3  Heparin Dosing Weight:   Vital Signs: Temp: 97.9 F (36.6 C) (10/07 0458) Temp src: Oral (10/07 0458) BP: 121/77 mmHg (10/07 0458) Pulse Rate: 78  (10/07 0458)  Labs:  Basename 06/04/12 0340 06/03/12 1958 06/03/12 0900  HGB 14.3 -- 15.9*  HCT 43.1 -- 46.2*  PLT 209 -- 227  APTT -- -- 36  LABPROT -- -- 13.8  INR -- -- 1.07  HEPARINUNFRC 0.28* 0.15* --  CREATININE -- -- 0.67  CKTOTAL -- -- --  CKMB -- -- --  TROPONINI -- -- <0.30    Estimated Creatinine Clearance: 85.1 ml/min (by C-G formula based on Cr of 0.67).   Medications:  Infusions:    . sodium chloride    . heparin 1,750 Units/hr (06/03/12 2245)  . DISCONTD: sodium chloride 1,000 mL (06/03/12 1241)    Assessment: Patient with heparin level below goal.  No issues per RN. Goal of Therapy:  Heparin level 0.3-0.7 units/ml Monitor platelets by anticoagulation protocol: Yes   Plan:  Increase heparin to 1900 units/hr, recheck level at 1200.  Darlina Guys, Jacquenette Shone Crowford 06/04/2012,5:13 AM

## 2012-06-05 LAB — CBC
HCT: 42.8 % (ref 36.0–46.0)
MCH: 31.5 pg (ref 26.0–34.0)
MCHC: 33.2 g/dL (ref 30.0–36.0)
MCV: 94.9 fL (ref 78.0–100.0)
RDW: 13.1 % (ref 11.5–15.5)

## 2012-06-05 MED ORDER — WARFARIN SODIUM 7.5 MG PO TABS
7.5000 mg | ORAL_TABLET | Freq: Once | ORAL | Status: AC
  Administered 2012-06-05: 7.5 mg via ORAL
  Filled 2012-06-05: qty 1

## 2012-06-05 MED ORDER — AMLODIPINE BESYLATE 10 MG PO TABS
10.0000 mg | ORAL_TABLET | Freq: Every morning | ORAL | Status: DC
  Filled 2012-06-05: qty 1

## 2012-06-05 NOTE — Progress Notes (Signed)
[facility] PROGRESS NOTE  Brittany [name]:[id number] DOB: [birth year] DOA: 06/03/2012 PCP: Lillia Mountain, MD  Brief narrative: 78 year old female with history of HTN, borderline ovarian tumor removed about 1 year ago) who was admitted 06/03/2012 for acute onset shortness of breath status post long trip to [place]. Patient was found to have an acute pulmonary embolism and DVT.   Assessment/Plan:   Principal Problem:  *Acute pulmonary embolus and DVT  Continue coumadin adn heparin per pharmacy protocol  INR 1.48 today   Active Problems:  Hypertension   BP 155/79  We will increase Norvasc to 10 mg daily for better BP control  Continue Hctz 25 mg daily and Irbesartan 300 mg daily  Hypokalemia   repleted  Potassium is within normal limits  Depression  Continue venlafaxine XR 225 mg daily  Code Status: full code  Family Communication: at bedside  Disposition Plan: home when stable; need PT evaluation   Manson Passey, MD  [facility]  Pager [phone]  If 7PM-7AM, please contact night-coverage [email or web address] Password [email or web address] 06/05/2012, 3:41 PM   LOS: 2 days   HPI/Subjective: No acute events overnight.  Objective: Filed Vitals:   06/04/12 2302 06/05/12 0554 06/05/12 0638 06/05/12 1300  BP: 121/83 137/96 137/96 155/79  Pulse: 97 91 91 82  Temp: 98.3 F (36.8 C) 97.9 F (36.6 C) 97.9 F (36.6 C) 98.2 F (36.8 C)  TempSrc: Oral Oral Oral Axillary  Resp: 20 20 20    Height:      Weight:      SpO2: 91% 95% 95% 93%    Intake/Output Summary (Last 24 hours) at 06/05/12 1541 Last data filed at 06/05/12 1502  Gross per 24 hour  Intake    480 ml  Output    550 ml  Net    -70 ml    Exam:   General:  Pt is alert, follows commands appropriately, not in acute distress  Cardiovascular: Regular rate and rhythm, S1/S2, no murmurs, no rubs, no gallops  Respiratory: Clear to auscultation bilaterally, no wheezing, no crackles, no rhonchi  Abdomen: Soft, non  tender, non distended, bowel sounds present, no guarding  Extremities: No edema, pulses DP and PT palpable bilaterally  Neuro: Grossly nonfocal  Data Reviewed: Basic Metabolic Panel:  Lab 06/04/12 8657 06/03/12 0900  NA 139 137  K 3.8 3.1*  CL 102 97  CO2 29 30  GLUCOSE 128* 153*  BUN 15 16  CREATININE 0.70 0.67  CALCIUM 8.9 9.2   Liver Function Tests:  Lab 06/03/12 0900  AST 12  ALT 11  ALKPHOS 92  BILITOT 0.8  PROT 7.1  ALBUMIN 3.3*   CBC:  Lab 06/05/12 0320 06/04/12 0340 06/03/12 0900  WBC 9.1 11.1* 11.6*  NEUTROABS -- -- 9.4*  HGB 14.2 14.3 15.9*  HCT 42.8 43.1 46.2*  MCV 94.9 94.1 91.8  PLT 199 209 227   Cardiac Enzymes:  Lab 06/03/12 0900  CKTOTAL --  CKMB --  CKMBINDEX --  TROPONINI <0.30   CBG:  Lab 06/03/12 0839  GLUCAP 150*    Scheduled Meds:   . amLODipine  5 mg Oral q morning - 10a  . docusate sodium  100 mg Oral BID  . hydrochlorothiazide  25 mg Oral Daily   And  . irbesartan  300 mg Oral Daily  . influenza  inactive virus vaccine  0.5 mL Intramuscular Tomorrow-1000  . pantoprazole  40 mg Oral Q0600  . pneumococcal 23 valent vaccine  0.5 mL Intramuscular Tomorrow-1000  .  senna  1 tablet Oral BID  . venlafaxine XR  225 mg Oral q morning - 10a  . warfarin  7.5 mg Oral ONCE-1800  . warfarin  7.5 mg Oral ONCE-1800  . Warfarin - Pharmacist Dosing Inpatient   Does not apply q1800   Continuous Infusions:   . sodium chloride    . heparin 2,100 Units/hr (06/05/12 0129)

## 2012-06-05 NOTE — Progress Notes (Signed)
ANTICOAGULATION CONSULT NOTE - Follow-Up Consult  Pharmacy Consult for Heparin/Warfarin Indication: PE/DVT  Allergies  Allergen Reactions  . Morphine And Related Nausea And Vomiting    Patient Measurements: Height: 5\' 6"  (167.6 cm) Weight: 293 lb (132.904 kg) IBW/kg (Calculated) : 59.3    Vital Signs: Temp: 97.9 F (36.6 C) (10/08 0638) Temp src: Oral (10/08 0638) BP: 137/96 mmHg (10/08 9562) Pulse Rate: 91  (10/08 0638)  Labs:  Basename 06/05/12 0320 06/04/12 2133 06/04/12 1230 06/04/12 0340 06/03/12 0900  HGB 14.2 -- -- 14.3 --  HCT 42.8 -- -- 43.1 46.2*  PLT 199 -- -- 209 227  APTT -- -- -- -- 36  LABPROT 17.5* -- -- -- 13.8  INR 1.48 -- -- -- 1.07  HEPARINUNFRC 0.37 0.36 0.22* -- --  CREATININE -- -- 0.70 -- 0.67  CKTOTAL -- -- -- -- --  CKMB -- -- -- -- --  TROPONINI -- -- -- -- <0.30    Estimated Creatinine Clearance: 85.1 ml/min (by C-G formula based on Cr of 0.7).   Medical History: Past Medical History  Diagnosis Date  . Hypertension   . Cellulitis   . Cataract   . Arthritis   . Clotting disorder     Previous blood clot in leg  . Depression     Medications:  Scheduled:     . amLODipine  5 mg Oral q morning - 10a  . docusate sodium  100 mg Oral BID  . heparin  1,500 Units Intravenous Once  . hydrochlorothiazide  25 mg Oral Daily   And  . irbesartan  300 mg Oral Daily  . influenza  inactive virus vaccine  0.5 mL Intramuscular Tomorrow-1000  . pantoprazole  40 mg Oral Q0600  . pneumococcal 23 valent vaccine  0.5 mL Intramuscular Tomorrow-1000  . senna  1 tablet Oral BID  . venlafaxine XR  225 mg Oral q morning - 10a  . warfarin  7.5 mg Oral ONCE-1800  . warfarin   Does not apply Once  . Warfarin - Pharmacist Dosing Inpatient   Does not apply q1800   Infusions:     . sodium chloride    . heparin 2,100 Units/hr (06/05/12 0129)  . DISCONTD: heparin 1,900 Units/hr (06/04/12 1308)   PRN: acetaminophen, acetaminophen, albuterol,  ketorolac, zolpidem, DISCONTD: acetaminophen, DISCONTD: acetaminophen, DISCONTD: acetaminophen  Assessment:  77yoF started on Heparin/Warfarin for DVT/PE.  Day 3/5 of required warfarin/heparin overlap  Heparin level therapeutic  INR subtherapeutic   CBC stable  Goal of Therapy:  INR 2-3 HL 0.3-0.7   Plan:  1. Continue heparin rate at 2100 units/hr (21 mL/hr) 2. Coumadin 7.5 mg x 1 3. Daily Heparin level, CBC 4. Daily PT/INR 5. Coumadin Education   [employer], [address].D. Clinical Oncology Pharmacist  Pager # [phone]  06/05/2012,9:54 AM

## 2012-06-06 LAB — CBC
HCT: 42.8 % (ref 36.0–46.0)
Hemoglobin: 13.9 g/dL (ref 12.0–15.0)
MCH: 30.5 pg (ref 26.0–34.0)
MCHC: 32.5 g/dL (ref 30.0–36.0)

## 2012-06-06 MED ORDER — WARFARIN SODIUM 5 MG PO TABS
5.0000 mg | ORAL_TABLET | Freq: Once | ORAL | Status: AC
  Administered 2012-06-06: 5 mg via ORAL
  Filled 2012-06-06: qty 1

## 2012-06-06 MED ORDER — AMLODIPINE BESYLATE 5 MG PO TABS
5.0000 mg | ORAL_TABLET | Freq: Every morning | ORAL | Status: DC
  Administered 2012-06-06 – 2012-06-09 (×4): 5 mg via ORAL
  Filled 2012-06-06 (×4): qty 1

## 2012-06-06 NOTE — Evaluation (Signed)
Physical Therapy Evaluation Patient Details Name: Brittany [name] MRN: [id number] DOB: 08-27-1935 Today's Date: 06/06/2012 Time: 9528-4132 PT Time Calculation (min): 26 min  PT Assessment / Plan / Recommendation Clinical Impression  77 y.o. female admitted with acute pulmonary embolus. Pt ambulated 75' with RW, SaO2 90% on RA, HR 105. Good progress expected. HHPT recommmended, no DME needed. Pt would benefit from acute to maximize safety and independence with mobility.    PT Assessment  Patient needs continued PT services    Follow Up Recommendations  Home health PT    Does the patient have the potential to tolerate intense rehabilitation      Barriers to Discharge None      Equipment Recommendations  None recommended by PT    Recommendations for Other Services     Frequency Min 3X/week    Precautions / Restrictions Restrictions Weight Bearing Restrictions: No   Pertinent Vitals/Pain *4/10 LLE premedicated**      Mobility  Bed Mobility Bed Mobility: Supine to Sit Supine to Sit: HOB elevated;6: Modified independent (Device/Increase time);With rails Transfers Transfers: Sit to Stand;Stand to Sit Sit to Stand: 5: Supervision;From bed Stand to Sit: 5: Supervision;With armrests;To chair/3-in-1 Details for Transfer Assistance: VCs hand placement Ambulation/Gait Ambulation/Gait Assistance: 4: Min guard Ambulation Distance (Feet): 75 Feet Assistive device: Rolling walker Gait Pattern: Step-through pattern General Gait Details: min/guard for safety/balance    Shoulder Instructions     Exercises     PT Diagnosis: Acute pain;Difficulty walking  PT Problem List: Decreased activity tolerance;Decreased mobility;Decreased knowledge of use of DME PT Treatment Interventions: DME instruction;Gait training;Stair training;Functional mobility training;Patient/family education   PT Goals Acute Rehab PT Goals PT Goal Formulation: With patient Time For Goal Achievement:  06/20/12 Potential to Achieve Goals: Good Pt will go Supine/Side to Sit: with HOB 0 degrees;Independently PT Goal: Supine/Side to Sit - Progress: Goal set today Pt will go Sit to Stand: with modified independence;with upper extremity assist PT Goal: Sit to Stand - Progress: Goal set today Pt will Ambulate: 51 - 150 feet;with modified independence;with least restrictive assistive device PT Goal: Ambulate - Progress: Goal set today Pt will Go Up / Down Stairs: 1-2 stairs;with least restrictive assistive device PT Goal: Up/Down Stairs - Progress: Goal set today  Visit Information  Last PT Received On: 06/06/12 Assistance Needed: +1    Subjective Data  Subjective: I will be able to manage at home. Patient Stated Goal: return to PLOF   Prior Functioning  Home Living Lives With: Alone Available Help at Discharge: Family Type of Home: House Home Access: Stairs to enter Secretary/administrator of Steps: 2 Entrance Stairs-Rails: None Home Layout: One level Bathroom Shower/Tub: Engineer, manufacturing systems: Standard Home Adaptive Equipment: Grab bars in [employer] - four wheeled;Straight cane Prior Function Level of Independence: Independent Able to Take Stairs?: Yes Driving: Yes Vocation: Retired Musician: No difficulties    Cognition  Overall Cognitive Status: Appears within functional limits for tasks assessed/performed Arousal/Alertness: Awake/alert Orientation Level: Appears intact for tasks assessed Behavior During Session: [facility] for tasks performed    Extremity/Trunk Assessment Right Upper Extremity Assessment RUE ROM/Strength/Tone: [facility] for tasks assessed Left Upper Extremity Assessment LUE ROM/Strength/Tone: WFL for tasks assessed Right Lower Extremity Assessment RLE ROM/Strength/Tone: WFL for tasks assessed RLE Coordination: WFL - gross/fine motor Left Lower Extremity Assessment LLE ROM/Strength/Tone: WFL for tasks assessed LLE Coordination:  WFL - gross/fine motor Trunk Assessment Trunk Assessment: Normal   Balance Balance Balance Assessed: Yes Static Standing Balance Static Standing -  Balance Support: No upper extremity supported Static Standing - Level of Assistance: 4: Min assist Static Standing - Comment/# of Minutes: 2  End of Session PT - End of Session Equipment Utilized During Treatment: Gait belt Activity Tolerance: Patient tolerated treatment well;Patient limited by fatigue Patient left: in chair;with call bell/phone within reach;with family/visitor present  GP     Tamala Ser 06/06/2012, 9:18 AM  [phone]

## 2012-06-06 NOTE — Progress Notes (Signed)
ANTICOAGULATION CONSULT NOTE - Follow-Up Consult  Pharmacy Consult for Heparin/Warfarin Indication: PE/DVT  Allergies  Allergen Reactions  . Morphine And Related Nausea And Vomiting    Patient Measurements: Height: 5\' 6"  (167.6 cm) Weight: 293 lb (132.904 kg) IBW/kg (Calculated) : 59.3    Vital Signs: Temp: 98.1 F (36.7 C) (10/09 0507) Temp src: Oral (10/09 0507) BP: 130/72 mmHg (10/09 1000) Pulse Rate: 87  (10/09 1000)  Labs:  Basename 06/06/12 0326 06/05/12 0320 06/04/12 2133 06/04/12 1230 06/04/12 0340  HGB 13.9 14.2 -- -- --  HCT 42.8 42.8 -- -- 43.1  PLT 274 199 -- -- 209  APTT -- -- -- -- --  LABPROT 21.3* 17.5* -- -- --  INR 1.93* 1.48 -- -- --  HEPARINUNFRC 0.39 0.37 0.36 -- --  CREATININE -- -- -- 0.70 --  CKTOTAL -- -- -- -- --  CKMB -- -- -- -- --  TROPONINI -- -- -- -- --   Estimated Creatinine Clearance: 85.1 ml/min (by C-G formula based on Cr of 0.7).  Medications:  Scheduled:     . amLODipine  5 mg Oral q morning - 10a  . docusate sodium  100 mg Oral BID  . hydrochlorothiazide  25 mg Oral Daily   And  . irbesartan  300 mg Oral Daily  . influenza  inactive virus vaccine  0.5 mL Intramuscular Tomorrow-1000  . pantoprazole  40 mg Oral Q0600  . pneumococcal 23 valent vaccine  0.5 mL Intramuscular Tomorrow-1000  . senna  1 tablet Oral BID  . venlafaxine XR  225 mg Oral q morning - 10a  . warfarin  7.5 mg Oral ONCE-1800  . Warfarin - Pharmacist Dosing Inpatient   Does not apply q1800  . DISCONTD: amLODipine  10 mg Oral q morning - 10a  . DISCONTD: amLODipine  5 mg Oral q morning - 10a   Infusions:     . sodium chloride 15 mL/hr at 06/06/12 0034  . heparin 2,100 Units/hr (06/06/12 1344)   Assessment:  77yoF started on Heparin/Warfarin for DVT/PE.  Day 4/5 of required warfarin/heparin overlap  Heparin level therapeutic  INR subtherapeutic but increasing.  CBC stable  Goal of Therapy:  INR 2-3 HL 0.3-0.7   Plan:  1. Continue  heparin rate at 2100 units/hr (21 mL/hr) 2. Coumadin 5 mg x 1 3. Daily Heparin level, CBC 4. Daily PT/INR 5. Coumadin Education   Chilton Si, Garnette Czech.D. Pager # [phone]  06/06/2012,1:54 PM

## 2012-06-06 NOTE — Progress Notes (Signed)
Subjective: No complaints  Objective: Vital signs in last 24 hours: Temp:  [97.9 F (36.6 C)-98.5 F (36.9 C)] 98.1 F (36.7 C) (10/09 0507) Pulse Rate:  [79-91] 80  (10/09 0507) Resp:  [20-24] 20  (10/09 0507) BP: (137-155)/(79-96) 148/88 mmHg (10/09 0507) SpO2:  [93 %-96 %] 93 % (10/09 0507) Weight change:  Last BM Date: 06/05/12  Intake/Output from previous day: 10/08 0701 - 10/09 0700 In: 1629 [P.O.:480; I.V.:1149] Out: 250 [Urine:250] Intake/Output this shift: Total I/O In: 1149 [I.V.:1149] Out: -   General appearance: alert and cooperative Resp: clear to auscultation bilaterally Cardio: regular rate and rhythm, S1, S2 normal, no murmur, click, rub or gallop and normal apical impulse Extremities: edema 1+ LLE  Lab Results:  Basename 06/06/12 0326 06/05/12 0320  WBC 7.5 9.1  HGB 13.9 14.2  HCT 42.8 42.8  PLT 274 199   BMET  Basename 06/04/12 1230 06/03/12 0900  NA 139 137  K 3.8 3.1*  CL 102 97  CO2 29 30  GLUCOSE 128* 153*  BUN 15 16  CREATININE 0.70 0.67  CALCIUM 8.9 9.2    Studies/Results: No results found.  Medications: I have reviewed the patient's current medications.  Assessment/Plan: Principal Problem:  *Acute pulmonary embolus continue heparin and coumadin Active Problems:  Hypertension ok  DVT of lower extremity (deep venous thrombosis)  Hypokalemia resolved  Ovarian tumor of borderline malignancy   PT consult   LOS: 3 days   [facility]  [phone] 06/06/2012, 6:01 AM

## 2012-06-07 ENCOUNTER — Inpatient Hospital Stay (HOSPITAL_COMMUNITY): Payer: Medicare Other

## 2012-06-07 LAB — PROTIME-INR: Prothrombin Time: 25.8 seconds — ABNORMAL HIGH (ref 11.6–15.2)

## 2012-06-07 LAB — CBC
Hemoglobin: 13.8 g/dL (ref 12.0–15.0)
MCH: 30.9 pg (ref 26.0–34.0)
MCV: 94 fL (ref 78.0–100.0)
Platelets: 278 10*3/uL (ref 150–400)
RBC: 4.47 MIL/uL (ref 3.87–5.11)
WBC: 7.1 10*3/uL (ref 4.0–10.5)

## 2012-06-07 LAB — HEPARIN LEVEL (UNFRACTIONATED): Heparin Unfractionated: 0.54 IU/mL (ref 0.30–0.70)

## 2012-06-07 MED ORDER — TRAMADOL HCL 50 MG PO TABS
50.0000 mg | ORAL_TABLET | Freq: Four times a day (QID) | ORAL | Status: DC | PRN
Start: 2012-06-07 — End: 2012-06-09
  Administered 2012-06-07 – 2012-06-09 (×4): 50 mg via ORAL
  Filled 2012-06-07 (×4): qty 1

## 2012-06-07 MED ORDER — PREDNISONE 50 MG PO TABS
60.0000 mg | ORAL_TABLET | Freq: Every day | ORAL | Status: DC
  Administered 2012-06-07 – 2012-06-09 (×3): 60 mg via ORAL
  Filled 2012-06-07 (×4): qty 1

## 2012-06-07 MED ORDER — ALBUTEROL SULFATE HFA 108 (90 BASE) MCG/ACT IN AERS
2.0000 | INHALATION_SPRAY | Freq: Four times a day (QID) | RESPIRATORY_TRACT | Status: DC
  Administered 2012-06-08 – 2012-06-09 (×5): 2 via RESPIRATORY_TRACT
  Filled 2012-06-07: qty 6.7

## 2012-06-07 MED ORDER — ALBUTEROL SULFATE (5 MG/ML) 0.5% IN NEBU
2.5000 mg | INHALATION_SOLUTION | Freq: Four times a day (QID) | RESPIRATORY_TRACT | Status: DC
  Administered 2012-06-07 (×4): 2.5 mg via RESPIRATORY_TRACT
  Filled 2012-06-07 (×4): qty 0.5

## 2012-06-07 MED ORDER — PANTOPRAZOLE SODIUM 40 MG PO TBEC: 40.0000 mg | DELAYED_RELEASE_TABLET | Freq: Every day | ORAL | Status: DC

## 2012-06-07 MED ORDER — ALBUTEROL SULFATE HFA 108 (90 BASE) MCG/ACT IN AERS: 2.0000 | INHALATION_SPRAY | Freq: Four times a day (QID) | RESPIRATORY_TRACT | Status: DC | PRN

## 2012-06-07 MED ORDER — HEPARIN (PORCINE) IN NACL 100-0.45 UNIT/ML-% IJ SOLN
2300.0000 [IU]/h | INTRAMUSCULAR | Status: DC
  Administered 2012-06-07 – 2012-06-08 (×2): 2300 [IU]/h via INTRAVENOUS
  Filled 2012-06-07 (×5): qty 250

## 2012-06-07 MED ORDER — WARFARIN SODIUM 5 MG PO TABS
5.0000 mg | ORAL_TABLET | Freq: Once | ORAL | Status: AC
  Administered 2012-06-07: 5 mg via ORAL
  Filled 2012-06-07: qty 1

## 2012-06-07 NOTE — Progress Notes (Signed)
Physical Therapy Treatment Patient Details Name: Brittany [name] MRN: [id number] DOB: [birth year] Today's Date: 06/07/2012 Time: 1027-2536 PT Time Calculation (min): 26 min  PT Assessment / Plan / Recommendation Comments on Treatment Session  Pt's gait is slow and antalgic with dyspnea, though O2 sats are > 90% on RA.  Began instruction on core and LE strengthening and offerred ideas for pt to establish a schedule at home to include daily exercise. Daughter in room acknowledges also. Pt will benefit from conitnued PT at home with HHPT    Follow Up Recommendations  Home health PT     Does the patient have the potential to tolerate intense rehabilitation     Barriers to Discharge        Equipment Recommendations  None recommended by PT    Recommendations for Other Services    Frequency Min 3X/week   Plan      Precautions / Restrictions     Pertinent Vitals/Pain Pt with pain in LLE with ambulation    Mobility  Transfers Transfers: Sit to Stand;Stand to Sit Sit to Stand: 5: Supervision;From chair/3-in-1 Stand to Sit: With armrests;To chair/3-in-1;6: Modified independent (Device/Increase time) Details for Transfer Assistance: VCs hand placement Ambulation/Gait Ambulation/Gait Assistance: 4: Min assist Ambulation Distance (Feet): 125 Feet Assistive device: Other (Comment) (hallway handrail) Ambulation/Gait Assistance Details: does not want to use a RW, althougth she has one at home and agrees to use it to walk from car to house.  She held onto IV pole or hallway handrail. Pt has dyspnea, but O2 sats are > 90% on RA with slow ambulation Gait Pattern: Step-through pattern;Antalgic;Decreased step length - left;Decreased stance time - left Gait velocity: decreased General Gait Details: pt has antalgic gait pattern with decreased weight bearing on LLE and decreased speed, but she does not want to use RW Stairs: Yes (verbally reviewed technique..pt acknowledges) Research officer, political party: No    Exercises Other Exercises Other Exercises: issued handout for beginning core and LE exercises Pt reviewed and verbally acknowledged how to do them   PT Diagnosis:    PT Problem List:   PT Treatment Interventions:     PT Goals Acute Rehab PT Goals PT Goal Formulation: With patient Time For Goal Achievement: 06/20/12 Potential to Achieve Goals: Good Pt will go Supine/Side to Sit: with HOB 0 degrees;Independently Pt will go Sit to Stand: with modified independence;with upper extremity assist PT Goal: Sit to Stand - Progress: Progressing toward goal Pt will Ambulate: 51 - 150 feet;with modified independence;with least restrictive assistive device PT Goal: Ambulate - Progress: Progressing toward goal Pt will Go Up / Down Stairs: 1-2 stairs;with least restrictive assistive device PT Goal: Up/Down Stairs - Progress: Progressing toward goal  Visit Information  Last PT Received On: 06/07/12 Assistance Needed: +1    Subjective Data  Subjective: I don't want to use a walker Patient Stated Goal: to go home   Cognition  Overall Cognitive Status: Appears within functional limits for tasks assessed/performed Arousal/Alertness: Awake/alert Orientation Level: Appears intact for tasks assessed Behavior During Session: [facility] for tasks performed    Balance  Balance Balance Assessed: Yes Static Standing Balance Static Standing - Balance Support: No upper extremity supported Static Standing - Level of Assistance: 7: Independent Static Standing - Comment/# of Minutes: 5 instructed to do isometric abdominal, glute and quad exercises in standing  End of Session PT - End of Session Activity Tolerance: Patient tolerated treatment well;Patient limited by fatigue Patient left: in chair;with call bell/phone within  reach;with family/visitor present   GP     Donnetta Hail 06/07/2012, 1:18 PM

## 2012-06-07 NOTE — Progress Notes (Signed)
Subjective: She has developed dry cough and wheezing.  SoB with ambulation   Objective: Vital signs in last 24 hours: Temp:  [97.8 F (36.6 C)-98.7 F (37.1 C)] 98.3 F (36.8 C) (10/10 0613) Pulse Rate:  [79-105] 82  (10/10 0613) Resp:  [16-20] 16  (10/10 0613) BP: (130-171)/(72-93) 141/83 mmHg (10/10 0613) SpO2:  [90 %-100 %] 100 % (10/10 0613) Weight change:  Last BM Date: 06/05/12  Intake/Output from previous day: 10/09 0701 - 10/10 0700 In: 1568.8 [P.O.:720; I.V.:848.8] Out: 700 [Urine:700] Intake/Output this shift: Total I/O In: 396 [I.V.:396] Out: 500 [Urine:500]  General appearance: alert and cooperative Resp: wheezes bilaterally Cardio: regular rate and rhythm, S1, S2 normal, no murmur, click, rub or gallop Extremities: edema 1+ LLE  Lab Results:  Basename 06/07/12 0340 06/06/12 0326  WBC 7.1 7.5  HGB 13.8 13.9  HCT 42.0 42.8  PLT 278 274   BMET  Basename 06/04/12 1230  NA 139  K 3.8  CL 102  CO2 29  GLUCOSE 128*  BUN 15  CREATININE 0.70  CALCIUM 8.9    Studies/Results: No results found.  Medications: Have Reviewed patients medications.      Assessment/Plan:  Principal Problem:  *Acute pulmonary embolus INR therapeutic, d/c heparin tomorrow after 24 hours therapeutic. Active Problems:  Asthmatic Bronchitis, has developed cough and wheezing bilaterally.  Treat with prednisone and bronchodilaters.  Check CXR Hypertension ok  DVT of lower extremity (deep venous thrombosis) order compression hose LLE  Ovarian tumor of borderline malignancy  PT consult noted   LOS: 4 days   Gurkirat Basher JOSEPH 06/07/2012, 6:51 AM

## 2012-06-07 NOTE — Progress Notes (Addendum)
ANTICOAGULATION CONSULT NOTE - Follow-Up Consult  Pharmacy Consult for Heparin/Warfarin Indication: PE/DVT  Allergies  Allergen Reactions  . Morphine And Related Nausea And Vomiting    Patient Measurements: Height: 5\' 6"  (167.6 cm) Weight: 293 lb (132.904 kg) IBW/kg (Calculated) : 59.3    Vital Signs: Temp: 98.3 F (36.8 C) (10/10 0613) Temp src: Oral (10/10 0613) BP: 141/83 mmHg (10/10 0613) Pulse Rate: 82  (10/10 0613)  Labs:  Basename 06/07/12 0340 06/06/12 0326 06/05/12 0320 06/04/12 1230  HGB 13.8 13.9 -- --  HCT 42.0 42.8 42.8 --  PLT 278 274 199 --  APTT -- -- -- --  LABPROT 25.8* 21.3* 17.5* --  INR 2.50* 1.93* 1.48 --  HEPARINUNFRC 0.24* 0.39 0.37 --  CREATININE -- -- -- 0.70  CKTOTAL -- -- -- --  CKMB -- -- -- --  TROPONINI -- -- -- --   Estimated Creatinine Clearance: 85.1 ml/min (by C-G formula based on Cr of 0.7).  Medications:  Scheduled:     . albuterol  2.5 mg Nebulization QID  . amLODipine  5 mg Oral q morning - 10a  . docusate sodium  100 mg Oral BID  . hydrochlorothiazide  25 mg Oral Daily   And  . irbesartan  300 mg Oral Daily  . pantoprazole  40 mg Oral Q0600  . predniSONE  60 mg Oral Q breakfast  . senna  1 tablet Oral BID  . venlafaxine XR  225 mg Oral q morning - 10a  . warfarin  5 mg Oral ONCE-1800  . Warfarin - Pharmacist Dosing Inpatient   Does not apply q1800  . DISCONTD: pantoprazole  40 mg Oral Q1200   Infusions:     . sodium chloride 15 mL/hr at 06/06/12 1900  . heparin 2,100 Units/hr (06/07/12 0120)   Assessment:  77yoF started on Heparin/Warfarin for DVT/PE.  Day 5/5 of required warfarin/heparin overlap  Heparin level slightly subtherapeutic  INR therapeutic for first time this AM  CBC stable  Goal of Therapy:  INR 2-3 HL 0.3-0.7   Plan:  1. Increase IV heparin from 2100 units/hr to 2300 units/hr and recheck level in 6 hrs 2. Continue heparin IV for likely one more day to complete required 24hrs of  therapeutic INR 3. Repeat Coumadin 5 mg today 4. Daily Heparin level, CBC 5. Daily PT/INR 6. Coumadin Education    Hessie Knows, PharmD, BCPS Pager [phone] 06/07/2012 11:08 AM   Addendum: Heparin level = 0.54 on 2300 units/hr which is within goal.  Continue current rate and recheck level in am.  Loralee Pacas, PharmD, BCPS 06/07/2012 6:30 PM

## 2012-06-08 LAB — HEPARIN LEVEL (UNFRACTIONATED): Heparin Unfractionated: 0.52 IU/mL (ref 0.30–0.70)

## 2012-06-08 LAB — CBC
HCT: 41.1 % (ref 36.0–46.0)
Hemoglobin: 13.6 g/dL (ref 12.0–15.0)
MCHC: 33.1 g/dL (ref 30.0–36.0)
RDW: 12.8 % (ref 11.5–15.5)
WBC: 7.9 10*3/uL (ref 4.0–10.5)

## 2012-06-08 LAB — PROTIME-INR
INR: 2.82 — ABNORMAL HIGH (ref 0.00–1.49)
Prothrombin Time: 28.2 seconds — ABNORMAL HIGH (ref 11.6–15.2)

## 2012-06-08 MED ORDER — WARFARIN SODIUM 2.5 MG PO TABS
2.5000 mg | ORAL_TABLET | Freq: Once | ORAL | Status: AC
  Administered 2012-06-08: 2.5 mg via ORAL
  Filled 2012-06-08: qty 1

## 2012-06-08 NOTE — Progress Notes (Signed)
ANTICOAGULATION CONSULT NOTE - Follow-Up Consult  Pharmacy Consult for Heparin/Warfarin Indication: PE/DVT  Allergies  Allergen Reactions  . Morphine And Related Nausea And Vomiting    Patient Measurements: Height: 5\' 6"  (167.6 cm) Weight: 293 lb (132.904 kg) IBW/kg (Calculated) : 59.3    Vital Signs: Temp: 97.9 F (36.6 C) (10/11 0636) Temp src: Oral (10/11 0636) BP: 138/80 mmHg (10/11 0636) Pulse Rate: 81  (10/11 0636)  Labs:  Basename 06/08/12 0335 06/07/12 1733 06/07/12 0340 06/06/12 0326  HGB 13.6 -- 13.8 --  HCT 41.1 -- 42.0 42.8  PLT 295 -- 278 274  APTT -- -- -- --  LABPROT 28.2* -- 25.8* 21.3*  INR 2.82* -- 2.50* 1.93*  HEPARINUNFRC 0.52 0.54 0.24* --  CREATININE -- -- -- --  CKTOTAL -- -- -- --  CKMB -- -- -- --  TROPONINI -- -- -- --   Estimated Creatinine Clearance: 85.1 ml/min (by C-G formula based on Cr of 0.7).  Medications:  Scheduled:     . albuterol  2 puff Inhalation QID  . amLODipine  5 mg Oral q morning - 10a  . docusate sodium  100 mg Oral BID  . hydrochlorothiazide  25 mg Oral Daily   And  . irbesartan  300 mg Oral Daily  . pantoprazole  40 mg Oral Q0600  . predniSONE  60 mg Oral Q breakfast  . senna  1 tablet Oral BID  . venlafaxine XR  225 mg Oral q morning - 10a  . warfarin  5 mg Oral ONCE-1800  . Warfarin - Pharmacist Dosing Inpatient   Does not apply q1800  . DISCONTD: albuterol  2.5 mg Nebulization QID   Infusions:     . sodium chloride 15 mL/hr at 06/06/12 1900  . DISCONTD: heparin Stopped (06/07/12 1132)  . DISCONTD: heparin 2,300 Units/hr (06/08/12 0144)   Assessment:  77yoF started on Heparin/Warfarin for DVT/PE.  INR therapeutic x 24 hr now and 5 days of overlap has been completed - IV heparin has been d/c'd  INR therapeutic  CBC stable  Goal of Therapy:  INR 2-3   Plan:  1. Warfarin 2.5mg  today 2. Daily INR 3. Noted plan to likely discharge tomorrow with home health  Hessie Knows, PharmD,  BCPS Pager [phone] 06/08/2012 7:13 AM

## 2012-06-08 NOTE — Progress Notes (Signed)
Subjective: Still come cough  Objective: Vital signs in last 24 hours: Temp:  [97.9 F (36.6 C)-98.2 F (36.8 C)] 97.9 F (36.6 C) (10/11 0636) Pulse Rate:  [81-107] 81  (10/11 0636) Resp:  [18-20] 20  (10/11 0636) BP: (138-160)/(80-98) 138/80 mmHg (10/11 0636) SpO2:  [90 %-99 %] 97 % (10/11 0636) Weight change:  Last BM Date: 06/05/12  Intake/Output from previous day: 10/10 0701 - 10/11 0700 In: 654 [P.O.:360; I.V.:294] Out: 802 [Urine:800; Stool:2] Intake/Output this shift:    General appearance: alert and cooperative Resp: wheezes bilaterally Cardio: regular rate and rhythm, S1, S2 normal, no murmur, click, rub or gallop Extremities: edema 1+ LLE  Lab Results:  Basename 06/08/12 0335 06/07/12 0340  WBC 7.9 7.1  HGB 13.6 13.8  HCT 41.1 42.0  PLT 295 278   BMET No results found for this basename: NA:2,K:2,CL:2,CO2:2,GLUCOSE:2,BUN:2,CREATININE:2,CALCIUM:2 in the last 72 hours  Studies/Results: Dg Chest Port 1 View  06/07/2012  *RADIOLOGY REPORT*  Clinical Data: Cough and short of breath.  Pulmonary embolism.  PORTABLE CHEST - 1 VIEW  Comparison: 06/03/2012  Findings: Improved aeration in the lungs with improved lung volume. Negative for heart failure or effusion.  Negative for pneumonia.  IMPRESSION: Improved aeration in the lungs.  The lungs are clear.   Original Report Authenticated By: Camelia Phenes, M.D.     Medications: I have reviewed the patient's current medications.  Assessment/Plan: Principal Problem:  *Acute pulmonary embolus INR therapeutic, d/c heparin, continue coumadin Active Problems:  Asthmatic Bronchitis, wheezing improved. Treat with prednisone and bronchodilaters.CXR ok Hypertension some elevation, follow DVT of lower extremity (deep venous thrombosis) order compression hose LLE  Ovarian tumor of borderline malignancy  PT consult noted  Anticipate discharge tomorrow with HHPT  RN   LOS: 5 days   Brittany [name] JOSEPH 06/08/2012, 7:01  AM

## 2012-06-08 NOTE — Progress Notes (Signed)
Physical Therapy Treatment Patient Details Name: Brittany [name] MRN: [id number] DOB: 1934-10-22 Today's Date: 06/08/2012 Time: 9629-5284 PT Time Calculation (min): 30 min  PT Assessment / Plan / Recommendation Comments on Treatment Session  pt improved today in gait with RW, she chooses a slow pace and does not have dyspenea, O2 sats temporarily decreased to 84% with amubulation, but  imcreased to  greater than 90 % with deep breathing  Pt issued incentive spirometer and demonstrated how to use it.     Follow Up Recommendations  Home health PT     Does the patient have the potential to tolerate intense rehabilitation     Barriers to Discharge        Equipment Recommendations  None recommended by PT    Recommendations for Other Services    Frequency Min 3X/week   Plan Discharge plan remains appropriate;Frequency remains appropriate    Precautions / Restrictions     Pertinent Vitals/Pain C/o pain in left leg after amublation    Mobility  Bed Mobility Bed Mobility: Supine to Sit;Sit to Supine Supine to Sit: 4: Min assist Sit to Supine: 4: Min assist Details for Bed Mobility Assistance: pt needed some assist to maneuver on hospital bed Transfers Transfers: Sit to Stand;Stand to Sit Sit to Stand: 5: Supervision;From chair/3-in-1 Stand to Sit: With armrests;To chair/3-in-1;6: Modified independent (Device/Increase time) Ambulation/Gait Ambulation/Gait Assistance: 5: Supervision Ambulation Distance (Feet): 300 Feet Assistive device: Other (Comment);Rolling walker (hallway handrail) Ambulation/Gait Assistance Details: pt with smooth reciprocol gait pattern with RW, able to bear weight throught both legs  Pt self selects slow pace Gait Pattern: Step-through pattern;Antalgic;Decreased step length - left;Decreased stance time - left Gait velocity: decreased General Gait Details: improved with RW Stairs: No (verbally reviewed technique..pt acknowledges) Stair Management  Technique:  (verbally reviewed, pt declined practicing) Wheelchair Mobility Wheelchair Mobility: No    Exercises     PT Diagnosis:    PT Problem List:   PT Treatment Interventions:     PT Goals Acute Rehab PT Goals PT Goal Formulation: With patient Time For Goal Achievement: 06/20/12 Potential to Achieve Goals: Good Pt will go Supine/Side to Sit: with HOB 0 degrees;Independently PT Goal: Supine/Side to Sit - Progress: Progressing toward goal Pt will go Sit to Stand: with modified independence;with upper extremity assist PT Goal: Sit to Stand - Progress: Progressing toward goal Pt will Ambulate: 51 - 150 feet;with modified independence;with least restrictive assistive device PT Goal: Ambulate - Progress: Met Pt will Go Up / Down Stairs: 1-2 stairs;with least restrictive assistive device PT Goal: Up/Down Stairs - Progress: Progressing toward goal  Visit Information  Last PT Received On: 06/08/12 Assistance Needed: +1    Subjective Data  Subjective: OK, I'll use a walker Patient Stated Goal: to go home tomorrow   Cognition  Overall Cognitive Status: Appears within functional limits for tasks assessed/performed Arousal/Alertness: Awake/alert Orientation Level: Appears intact for tasks assessed Behavior During Session: [facility] for tasks performed    Balance     End of Session PT - End of Session Activity Tolerance: Patient tolerated treatment well Patient left: in chair;with call bell/phone within reach;with family/visitor present   GP     Donnetta Hail 06/08/2012, 1:26 PM

## 2012-06-09 LAB — CBC
HCT: 43.7 % (ref 36.0–46.0)
MCHC: 32.7 g/dL (ref 30.0–36.0)
RDW: 13.1 % (ref 11.5–15.5)
WBC: 9 10*3/uL (ref 4.0–10.5)

## 2012-06-09 LAB — PROTIME-INR
INR: 2.64 — ABNORMAL HIGH (ref 0.00–1.49)
Prothrombin Time: 26.9 seconds — ABNORMAL HIGH (ref 11.6–15.2)

## 2012-06-09 MED ORDER — WARFARIN SODIUM 5 MG PO TABS: 5.0000 mg | ORAL_TABLET | Freq: Every day | ORAL | Status: DC

## 2012-06-09 MED ORDER — PREDNISONE 20 MG PO TABS: 40.0000 mg | ORAL_TABLET | Freq: Every day | ORAL | Status: DC

## 2012-06-09 MED ORDER — TRAMADOL HCL 50 MG PO TABS: 50.0000 mg | ORAL_TABLET | Freq: Four times a day (QID) | ORAL | Status: DC | PRN

## 2012-06-09 MED ORDER — ALBUTEROL SULFATE HFA 108 (90 BASE) MCG/ACT IN AERS: 2.0000 | INHALATION_SPRAY | Freq: Four times a day (QID) | RESPIRATORY_TRACT | Status: AC | PRN

## 2012-06-09 NOTE — Discharge Summary (Signed)
Physician Discharge Summary  Patient ID: Brittany [name] MRN: [id number] DOB/AGE: 1934-12-21 77 y.o.  Admit date: 06/03/2012 Discharge date: 06/09/2012  Admission Diagnoses: Acute pulmonary embolus Left leg DVT Hypertension Ovarian tumor of borderline malignancy    Discharge Diagnoses:  Principal Problem:  *Acute pulmonary embolus Active Problems:  DVT of lower extremity (deep venous thrombosis) Asthmatic bronchitis Hypertension  Hypokalemia  Ovarian tumor of borderline malignancy   Discharged Condition: good  Hospital Course: The patient presented on October 6 after a long car trip to [place] with 3-4 days of progressive shortness of breath and swelling in her left lower extremity. In the ER she was found to have a pulmonary artery and a DVT in the left lower extremity. CT of the chest showed large clot burden right pulmonary arterial tree. The venous ultrasound showed acute DVT in the left heel and posterior tibial veins. Echocardiogram showed mild dilation left ventricle, normal systolic function EF 50-55%, abnormal left ventricular relaxation present. The patient was started on IV heparin and warfarin. She was seen by physical therapy and by discharge was ambulating without significant difficulty. Her oxygen level on room air at discharge was 91%. She did have a hypercoagulability workup done which showed increased protein C. activity, positive lupus anticoagulant and mild elevation of beta 2 glycoprotein IgM and IgA. Anticardiolipin IgG and IgM were mildly elevated. Factor V Leiden was negative, protein S. activity normal and homocystine normal. The patient's INR at discharge was 2.62 and will be followed as an outpatient, discharged on Coumadin 5 mg into half milligrams alternating every other day. The patient did develop cough and wheezing during hospitalization and was diagnosed with asthmatic bronchitis and treated with prednisone and bronchodilators with improvement by discharge.  Her blood pressure remained under reasonable control. Home health physical therapy and nursing were arranged. She complained of some neck pain chronic issue and was prescribed tramadol which provides good relief.  Consults: None  Significant Diagnostic Studies: labs: As above, radiology: CXR: normal and CT scan: as above   and cardiac graphics: Echocardiogram: As above  Treatments: analgesia: Tramadol, anticoagulation: heparin and warfarin and steroids: prednisone  Discharge Exam: Blood pressure 132/79, pulse 83, temperature 98.2 F (36.8 C), temperature source Oral, resp. rate 18, height 5\' 6"  (1.676 m), weight 132.904 kg (293 lb), SpO2 90.00%. Resp: clear to auscultation bilaterally Cardio: regular rate and rhythm, S1, S2 normal, no murmur, click, rub or gallop Extremities: edema 1+ left lower extremity   Disposition: 01-Home or Self Care     Medication List     As of 06/09/2012 10:05 AM    STOP taking these medications         aspirin 500 MG buffered tablet      TAKE these medications         albuterol 108 (90 BASE) MCG/ACT inhaler   Commonly known as: PROVENTIL HFA;VENTOLIN HFA   Inhale 2 puffs into the lungs every 6 (six) hours as needed for wheezing or shortness of breath.      amLODipine 5 MG tablet   Commonly known as: NORVASC   Take 5 mg by mouth every morning.      cyclobenzaprine 5 MG tablet   Commonly known as: FLEXERIL   Take 5 mg by mouth 2 (two) times daily as needed. For spasms      EFFEXOR XR 75 MG 24 hr capsule   Generic drug: venlafaxine XR   Take 225 mg by mouth every morning.      olmesartan-hydrochlorothiazide  40-25 MG per tablet   Commonly known as: BENICAR HCT   Take 1 tablet by mouth every morning.      predniSONE 20 MG tablet   Commonly known as: DELTASONE   Take 2 tablets (40 mg total) by mouth daily with breakfast.      traMADol 50 MG tablet   Commonly known as: ULTRAM   Take 1 tablet (50 mg total) by mouth every 6 (six) hours as  needed.      warfarin 5 MG tablet   Commonly known as: COUMADIN   Take 1 tablet (5 mg total) by mouth daily. Alternate 1 tablet and 1/2 tablet every other evening, start with one tablet tonight           Follow-up Information    Follow up with Lillia Mountain, MD. Call in 9 days.   Contact information:   [address], [address] Jaynie Crumble [place] [address] [phone]          Signed: Lillia Mountain 06/09/2012, 10:05 AM

## 2012-06-12 DIAGNOSIS — Z7901 Long term (current) use of anticoagulants: Secondary | ICD-10-CM | POA: Diagnosis not present

## 2012-06-15 DIAGNOSIS — Z7901 Long term (current) use of anticoagulants: Secondary | ICD-10-CM | POA: Diagnosis not present

## 2012-06-19 DIAGNOSIS — Z7901 Long term (current) use of anticoagulants: Secondary | ICD-10-CM | POA: Diagnosis not present

## 2012-06-19 DIAGNOSIS — J45909 Unspecified asthma, uncomplicated: Secondary | ICD-10-CM | POA: Diagnosis not present

## 2012-06-19 DIAGNOSIS — I2699 Other pulmonary embolism without acute cor pulmonale: Secondary | ICD-10-CM | POA: Diagnosis not present

## 2012-06-26 DIAGNOSIS — M542 Cervicalgia: Secondary | ICD-10-CM | POA: Diagnosis not present

## 2012-06-28 DIAGNOSIS — M542 Cervicalgia: Secondary | ICD-10-CM | POA: Diagnosis not present

## 2012-06-28 DIAGNOSIS — Z7901 Long term (current) use of anticoagulants: Secondary | ICD-10-CM | POA: Diagnosis not present

## 2012-06-29 DIAGNOSIS — N3941 Urge incontinence: Secondary | ICD-10-CM | POA: Diagnosis not present

## 2012-07-02 DIAGNOSIS — M542 Cervicalgia: Secondary | ICD-10-CM | POA: Diagnosis not present

## 2012-07-03 DIAGNOSIS — M542 Cervicalgia: Secondary | ICD-10-CM | POA: Diagnosis not present

## 2012-07-06 DIAGNOSIS — Z7901 Long term (current) use of anticoagulants: Secondary | ICD-10-CM | POA: Diagnosis not present

## 2012-07-11 DIAGNOSIS — M542 Cervicalgia: Secondary | ICD-10-CM | POA: Diagnosis not present

## 2012-07-13 DIAGNOSIS — M542 Cervicalgia: Secondary | ICD-10-CM | POA: Diagnosis not present

## 2012-07-19 DIAGNOSIS — M47812 Spondylosis without myelopathy or radiculopathy, cervical region: Secondary | ICD-10-CM | POA: Diagnosis not present

## 2012-07-20 DIAGNOSIS — Z7901 Long term (current) use of anticoagulants: Secondary | ICD-10-CM | POA: Diagnosis not present

## 2012-08-08 DIAGNOSIS — R350 Frequency of micturition: Secondary | ICD-10-CM | POA: Diagnosis not present

## 2012-08-08 DIAGNOSIS — N3941 Urge incontinence: Secondary | ICD-10-CM | POA: Diagnosis not present

## 2012-08-10 DIAGNOSIS — Z7901 Long term (current) use of anticoagulants: Secondary | ICD-10-CM | POA: Diagnosis not present

## 2012-08-17 DIAGNOSIS — Z7901 Long term (current) use of anticoagulants: Secondary | ICD-10-CM | POA: Diagnosis not present

## 2012-08-17 DIAGNOSIS — R51 Headache: Secondary | ICD-10-CM | POA: Diagnosis not present

## 2012-08-20 NOTE — Op Note (Unsigned)
ADMITTED:            05/07/2002      PROCEDURE DATE: 05/07/2002      ROOM NUMBER:         [id number]            PREOPERATIVE DIAGNOSIS:  Lateral meniscal tear of right knee.            POSTOPERATIVE DIAGNOSIS:  Medial and lateral meniscal tear of right knee.            PROCEDURE:      1.    Arthroscopy.      2.    Partial medial and partial lateral meniscectomy.            ANESTHESIA:  Local with sedation.            DESCRIPTION OF PROCEDURE:  After local anesthesia was administered, the      patient was prepped and draped in the usual sterile fashion using DuraPrep.      Arthroscopy was carried out through an anterolateral portal with an      anteromedial portal with an anteromedial portal used for intraarticular      probing instrumentation.  The findings were as follows.  There was a modest      amount of chondromalacia under the patella not requiring shaving.  The      trochlea appeared to be fairly normal.  The medial compartment showed a      horizontal [**] tear and mild flapping of the posterior horn of the medial      meniscus which was shaved with a shaver.  The anterior cruciate ligament      was normal.  The lateral meniscus had some complex tearing of the anterior      mid portion of the lateral meniscus which was shaved with a shaver and      basket forceps.  After adequate debridement of both menisci, the joint was      irrigated with saline and instilled with 20 cc of 0.5% Marcaine with 1 cc      of Depo-Medrol . The instrumentation was removed and the stab wound was      approximated with 4-0 nylon.  Adaptic was applied followed by dry sterile      dressing, Kerlix and an Ace bandage.  There were no intraoperative      complications.  The patient tolerated the procedure well and was brought to      the recovery room in stable condition.                  ___________________________________       ______________________      Mariana Kaufman, MD                           Date            D 05/07/2002  10:42 A; T 05/08/2002  7:21 A; 454 - - [id number], [id number],      #[id number]      CC:   Mariana Kaufman, MD

## 2012-08-31 DIAGNOSIS — Z7901 Long term (current) use of anticoagulants: Secondary | ICD-10-CM | POA: Diagnosis not present

## 2012-09-07 DIAGNOSIS — M47812 Spondylosis without myelopathy or radiculopathy, cervical region: Secondary | ICD-10-CM | POA: Diagnosis not present

## 2012-09-10 DIAGNOSIS — R350 Frequency of micturition: Secondary | ICD-10-CM | POA: Diagnosis not present

## 2012-09-10 DIAGNOSIS — N3941 Urge incontinence: Secondary | ICD-10-CM | POA: Diagnosis not present

## 2012-09-20 DIAGNOSIS — M47812 Spondylosis without myelopathy or radiculopathy, cervical region: Secondary | ICD-10-CM | POA: Diagnosis not present

## 2012-09-21 DIAGNOSIS — Z7901 Long term (current) use of anticoagulants: Secondary | ICD-10-CM | POA: Diagnosis not present

## 2012-10-02 DIAGNOSIS — M47812 Spondylosis without myelopathy or radiculopathy, cervical region: Secondary | ICD-10-CM | POA: Diagnosis not present

## 2012-10-02 DIAGNOSIS — R51 Headache: Secondary | ICD-10-CM | POA: Diagnosis not present

## 2012-10-09 DIAGNOSIS — Z1331 Encounter for screening for depression: Secondary | ICD-10-CM | POA: Diagnosis not present

## 2012-10-09 DIAGNOSIS — M5030 Other cervical disc degeneration, unspecified cervical region: Secondary | ICD-10-CM | POA: Diagnosis not present

## 2012-10-09 DIAGNOSIS — Z0000 Encounter for general adult medical examination without abnormal findings: Secondary | ICD-10-CM | POA: Diagnosis not present

## 2012-10-09 DIAGNOSIS — I10 Essential (primary) hypertension: Secondary | ICD-10-CM | POA: Diagnosis not present

## 2012-10-09 DIAGNOSIS — Z7901 Long term (current) use of anticoagulants: Secondary | ICD-10-CM | POA: Diagnosis not present

## 2012-10-09 DIAGNOSIS — F325 Major depressive disorder, single episode, in full remission: Secondary | ICD-10-CM | POA: Diagnosis not present

## 2012-10-16 DIAGNOSIS — Z7901 Long term (current) use of anticoagulants: Secondary | ICD-10-CM | POA: Diagnosis not present

## 2012-10-16 DIAGNOSIS — R51 Headache: Secondary | ICD-10-CM | POA: Diagnosis not present

## 2012-10-16 DIAGNOSIS — M47812 Spondylosis without myelopathy or radiculopathy, cervical region: Secondary | ICD-10-CM | POA: Diagnosis not present

## 2012-10-19 DIAGNOSIS — I10 Essential (primary) hypertension: Secondary | ICD-10-CM | POA: Diagnosis not present

## 2012-10-19 DIAGNOSIS — Z136 Encounter for screening for cardiovascular disorders: Secondary | ICD-10-CM | POA: Diagnosis not present

## 2012-10-19 DIAGNOSIS — Z7901 Long term (current) use of anticoagulants: Secondary | ICD-10-CM | POA: Diagnosis not present

## 2012-11-09 DIAGNOSIS — R350 Frequency of micturition: Secondary | ICD-10-CM | POA: Diagnosis not present

## 2012-11-13 ENCOUNTER — Emergency Department (HOSPITAL_COMMUNITY): Payer: Medicare Other

## 2012-11-13 ENCOUNTER — Emergency Department (HOSPITAL_COMMUNITY)
Admission: EM | Admit: 2012-11-13 | Discharge: 2012-11-13 | Disposition: A | Discharge status: Home / Self Care | Payer: Medicare Other | Attending: Emergency Medicine | Admitting: Emergency Medicine

## 2012-11-13 ENCOUNTER — Encounter (HOSPITAL_COMMUNITY): Payer: Self-pay | Admitting: Emergency Medicine

## 2012-11-13 DIAGNOSIS — Z9889 Other specified postprocedural states: Secondary | ICD-10-CM | POA: Insufficient documentation

## 2012-11-13 DIAGNOSIS — F329 Major depressive disorder, single episode, unspecified: Secondary | ICD-10-CM | POA: Insufficient documentation

## 2012-11-13 DIAGNOSIS — N200 Calculus of kidney: Secondary | ICD-10-CM | POA: Insufficient documentation

## 2012-11-13 DIAGNOSIS — Z79899 Other long term (current) drug therapy: Secondary | ICD-10-CM | POA: Diagnosis not present

## 2012-11-13 DIAGNOSIS — D689 Coagulation defect, unspecified: Secondary | ICD-10-CM | POA: Insufficient documentation

## 2012-11-13 DIAGNOSIS — F3289 Other specified depressive episodes: Secondary | ICD-10-CM | POA: Insufficient documentation

## 2012-11-13 DIAGNOSIS — Z87891 Personal history of nicotine dependence: Secondary | ICD-10-CM | POA: Diagnosis not present

## 2012-11-13 DIAGNOSIS — I10 Essential (primary) hypertension: Secondary | ICD-10-CM | POA: Diagnosis not present

## 2012-11-13 DIAGNOSIS — Z9079 Acquired absence of other genital organ(s): Secondary | ICD-10-CM | POA: Diagnosis not present

## 2012-11-13 DIAGNOSIS — Z872 Personal history of diseases of the skin and subcutaneous tissue: Secondary | ICD-10-CM | POA: Insufficient documentation

## 2012-11-13 DIAGNOSIS — R110 Nausea: Secondary | ICD-10-CM | POA: Insufficient documentation

## 2012-11-13 DIAGNOSIS — Z8669 Personal history of other diseases of the nervous system and sense organs: Secondary | ICD-10-CM | POA: Diagnosis not present

## 2012-11-13 DIAGNOSIS — Z7901 Long term (current) use of anticoagulants: Secondary | ICD-10-CM | POA: Insufficient documentation

## 2012-11-13 DIAGNOSIS — M129 Arthropathy, unspecified: Secondary | ICD-10-CM | POA: Insufficient documentation

## 2012-11-13 DIAGNOSIS — R1031 Right lower quadrant pain: Secondary | ICD-10-CM | POA: Diagnosis not present

## 2012-11-13 DIAGNOSIS — N1330 Unspecified hydronephrosis: Secondary | ICD-10-CM | POA: Diagnosis not present

## 2012-11-13 LAB — CBC WITH DIFFERENTIAL/PLATELET
Basophils Absolute: 0 10*3/uL (ref 0.0–0.1)
Basophils Relative: 0 % (ref 0–1)
Lymphocytes Relative: 11 % — ABNORMAL LOW (ref 12–46)
MCHC: 33.3 g/dL (ref 30.0–36.0)
Neutro Abs: 7.5 10*3/uL (ref 1.7–7.7)
Neutrophils Relative %: 84 % — ABNORMAL HIGH (ref 43–77)
Platelets: 188 10*3/uL (ref 150–400)
RDW: 13.2 % (ref 11.5–15.5)
WBC: 8.9 10*3/uL (ref 4.0–10.5)

## 2012-11-13 LAB — COMPREHENSIVE METABOLIC PANEL
ALT: 12 U/L (ref 0–35)
AST: 17 U/L (ref 0–37)
Albumin: 3.9 g/dL (ref 3.5–5.2)
CO2: 28 mEq/L (ref 19–32)
Chloride: 102 mEq/L (ref 96–112)
Creatinine, Ser: 0.81 mg/dL (ref 0.50–1.10)
Potassium: 3.7 mEq/L (ref 3.5–5.1)
Sodium: 141 mEq/L (ref 135–145)
Total Bilirubin: 0.5 mg/dL (ref 0.3–1.2)

## 2012-11-13 LAB — URINALYSIS, ROUTINE W REFLEX MICROSCOPIC
Glucose, UA: NEGATIVE mg/dL
Ketones, ur: NEGATIVE mg/dL
Leukocytes, UA: NEGATIVE
pH: 5.5 (ref 5.0–8.0)

## 2012-11-13 LAB — URINE MICROSCOPIC-ADD ON

## 2012-11-13 MED ORDER — ONDANSETRON 8 MG PO TBDP: 8.0000 mg | ORAL_TABLET | Freq: Three times a day (TID) | ORAL | Status: DC | PRN

## 2012-11-13 MED ORDER — DOCUSATE SODIUM 100 MG PO CAPS: 100.0000 mg | ORAL_CAPSULE | Freq: Two times a day (BID) | ORAL | Status: DC | PRN

## 2012-11-13 MED ORDER — ONDANSETRON HCL 4 MG/2ML IJ SOLN
4.0000 mg | Freq: Once | INTRAMUSCULAR | Status: AC
  Administered 2012-11-13: 4 mg via INTRAVENOUS
  Filled 2012-11-13: qty 2

## 2012-11-13 MED ORDER — HYDROMORPHONE HCL PF 1 MG/ML IJ SOLN
0.5000 mg | Freq: Once | INTRAMUSCULAR | Status: AC
  Administered 2012-11-13: 0.5 mg via INTRAVENOUS
  Filled 2012-11-13: qty 1

## 2012-11-13 MED ORDER — SODIUM CHLORIDE 0.9 % IV SOLN
1000.0000 mL | Freq: Once | INTRAVENOUS | Status: AC
  Administered 2012-11-13: 1000 mL via INTRAVENOUS

## 2012-11-13 MED ORDER — OXYCODONE-ACETAMINOPHEN 5-325 MG PO TABS: 1.0000 | ORAL_TABLET | Freq: Four times a day (QID) | ORAL | Status: DC | PRN

## 2012-11-13 MED ORDER — HYDROMORPHONE HCL PF 1 MG/ML IJ SOLN
0.5000 mg | INTRAMUSCULAR | Status: AC | PRN
  Administered 2012-11-13 (×3): 0.5 mg via INTRAVENOUS
  Filled 2012-11-13 (×3): qty 1

## 2012-11-13 MED ORDER — SODIUM CHLORIDE 0.9 % IV SOLN: 1000.0000 mL | INTRAVENOUS | Status: DC

## 2012-11-13 NOTE — ED Provider Notes (Signed)
History    CSN: [id number] Arrival date & time 11/13/12  1221 First MD Initiated Contact with Patient 11/13/12 1245      Chief Complaint  Patient presents with  . Abdominal Pain    right lower    HPI Comments: Pt had gotten up this morning and it started to hurt.  She felt like she had to go to the bathroom.  The pain started around 10am.  History of prior abdominal surgeries. No history of kidney stones.  Patient is a 78 y.o. female presenting with abdominal pain. The history is provided by the patient.  Abdominal Pain Pain location:  RLQ Pain quality: sharp   Pain radiates to:  Back Pain severity:  Severe Onset quality:  Sudden Duration:  3 hours Timing:  Constant Progression:  Worsening Relieved by:  Nothing Exacerbated by: severe no matter what position. Associated symptoms: nausea   Associated symptoms: no chest pain, no diarrhea, no dysuria, no fever and no vomiting     Past Medical History  Diagnosis Date  . Hypertension   . Cellulitis   . Cataract   . Arthritis   . Clotting disorder     Previous blood clot in leg  . Depression     Past Surgical History  Procedure Laterality Date  . Pelvic laparoscopy  2012    BORDERLINE OVARIAN TUMOR SEROUS RIGHT  . Cataract extraction    . Foot surgery    . Varicose veins    . Oophorectomy  2012    BSO  ENDOSALPINGIOSIS  . Abdominal surgery  2012    UMBILICAL HERNIA    Family History  Problem Relation Age of Onset  . Heart disease Mother   . Diabetes Brother   . Hypertension Brother   . Breast cancer Daughter   . Hypertension Daughter   . Cancer Daughter     breast and liver    History  Substance Use Topics  . Smoking status: Former Smoker -- 0.50 packs/day    Types: Cigarettes  . Smokeless tobacco: Not on file  . Alcohol Use: No    OB History   Grav Para Term Preterm Abortions TAB SAB Ect Mult Living   4 4        4       Review of Systems  Constitutional: Negative for fever.  Cardiovascular:  Negative for chest pain.  Gastrointestinal: Positive for nausea and abdominal pain. Negative for vomiting and diarrhea.  Genitourinary: Negative for dysuria.  All other systems reviewed and are negative.    Allergies  Morphine and related  Home Medications   Current Outpatient Rx  Name  Route  Sig  Dispense  Refill  . albuterol (PROVENTIL HFA;VENTOLIN HFA) 108 (90 BASE) MCG/ACT inhaler   Inhalation   Inhale 2 puffs into the lungs every 6 (six) hours as needed for wheezing or shortness of breath.   1 Inhaler   1   . amLODipine (NORVASC) 5 MG tablet   Oral   Take 5 mg by mouth every morning.          . cephALEXin (KEFLEX) 500 MG capsule   Oral   Take 500 mg by mouth 3 (three) times daily.         . cyclobenzaprine (FLEXERIL) 5 MG tablet   Oral   Take 5 mg by mouth 2 (two) times daily as needed. For spasms         . fesoterodine (TOVIAZ) 8 MG TB24   Oral  Take 8 mg by mouth daily.         Marland Kitchen olmesartan-hydrochlorothiazide (BENICAR HCT) 40-25 MG per tablet   Oral   Take 1 tablet by mouth every morning.          . venlafaxine (EFFEXOR XR) 75 MG 24 hr capsule   Oral   Take 225 mg by mouth every morning.          . warfarin (COUMADIN) 5 MG tablet   Oral   Take 5 mg by mouth every evening.         . docusate sodium (COLACE) 100 MG capsule   Oral   Take 1 capsule (100 mg total) by mouth 2 (two) times daily as needed for constipation.   14 capsule   0     (the percocet may cause constipation)   . ondansetron (ZOFRAN ODT) 8 MG disintegrating tablet   Oral   Take 1 tablet (8 mg total) by mouth every 8 (eight) hours as needed for nausea.   20 tablet   0   . oxyCODONE-acetaminophen (PERCOCET/ROXICET) 5-325 MG per tablet   Oral   Take 1-2 tablets by mouth every 6 (six) hours as needed for pain.   30 tablet   0   . traMADol (ULTRAM) 50 MG tablet   Oral   Take 1 tablet (50 mg total) by mouth every 6 (six) hours as needed.   30 tablet   1     BP  148/96  Pulse 86  Temp(Src) 98.4 F (36.9 C) (Oral)  Resp 20  SpO2 94%  Physical Exam  Nursing note and vitals reviewed. Constitutional: She appears distressed.  Morbidly obese   HENT:  Head: Normocephalic and atraumatic.  Right Ear: External ear normal.  Left Ear: External ear normal.  Eyes: Conjunctivae are normal. Right eye exhibits no discharge. Left eye exhibits no discharge. No scleral icterus.  Neck: Neck supple. No tracheal deviation present.  Cardiovascular: Normal rate, regular rhythm and intact distal pulses.   Pulmonary/Chest: Effort normal and breath sounds normal. No stridor. No respiratory distress. She has no wheezes. She has no rales.  Abdominal: Soft. Bowel sounds are normal. She exhibits no distension. There is no tenderness. There is no rebound and no guarding.  Musculoskeletal: She exhibits no edema and no tenderness.  Neurological: She is alert. She has normal strength. No sensory deficit. Cranial nerve deficit:  no gross defecits noted. She exhibits normal muscle tone. She displays no seizure activity. Coordination normal.  Skin: Skin is warm and dry. No rash noted. She is not diaphoretic.  Psychiatric: She has a normal mood and affect.    ED Course  Procedures (including critical care time)  Labs Reviewed  CBC WITH DIFFERENTIAL - Abnormal; Notable for the following:    RBC 5.59 (*)    Hemoglobin 17.6 (*)    HCT 52.9 (*)    Neutrophils Relative 84 (*)    Lymphocytes Relative 11 (*)    All other components within normal limits  COMPREHENSIVE METABOLIC PANEL - Abnormal; Notable for the following:    Glucose, Bld 134 (*)    BUN 28 (*)    GFR calc non Af Amer 69 (*)    GFR calc Af Amer 80 (*)    All other components within normal limits  URINALYSIS, ROUTINE W REFLEX MICROSCOPIC - Abnormal; Notable for the following:    APPearance CLOUDY (*)    Hgb urine dipstick LARGE (*)    Protein, ur 30 (*)  All other components within normal limits  URINE  MICROSCOPIC-ADD ON - Abnormal; Notable for the following:    Squamous Epithelial / LPF MANY (*)    Bacteria, UA FEW (*)    All other components within normal limits  LIPASE, BLOOD   Ct Abdomen Pelvis Wo Contrast  11/13/2012  **ADDENDUM** CREATED: 11/13/2012 16:31:59  The previously noted left periumbilical hernia is again noted containing only fat.  **END ADDENDUM** SIGNED BY: Colon Flattery. Gery Pray, M.D.   11/13/2012  *RADIOLOGY REPORT*  Clinical Data: Right lower quadrant abdominal pain today with nausea, tenderness on palpation  CT ABDOMEN AND PELVIS WITHOUT CONTRAST  Technique:  Multidetector CT imaging of the abdomen and pelvis was performed following the standard protocol without intravenous contrast.  Comparison: CT abdomen pelvis of 07/12/2010  Findings: The small ground-glass opacity in the right middle lobe previously described is most likely stable but it is can incompletely imaged on the current CT abdomen pelvis.  It does appear to be similar to prior CT of the chest of 06/03/2012 as well most likely is stable.  Continued follow-up however is recommended in 1 year.  A moderate amount of epicardial fat is present.  The liver is unremarkable in the unenhanced state.  No ductal dilatation is seen.  No calcified gallstones are noted.  The pancreas is normal in size and the pancreatic duct is not dilated.  There are low attenuation adrenal nodules bilaterally most consistent with adrenal adenomas, which appear stable.  The spleen is normal in size.  The stomach is moderately fluid distended with no abnormality noted.  No left renal calculi are seen and there is no evidence of left- sided hydronephrosis.  However, there is moderate hydronephrosis present and nonobstructing right mid renal calculus measures 12 x 7 x 12 mm.  The right ureter remains dilated to the urinary bladder. Within urinary bladder there is a 3 mm calculus layering posteriorly most likely representing a recently passed with resultant edema  of the right UV junction.  The left ureter is normal in caliber.  The abdominal aorta is normal in caliber with no aneurysm and mild atheromatous change is present.  No adenopathy is seen.  The urinary bladder is unremarkable other than the previously mentioned 3 mm dependently layering calculus.  The uterus is normal in size and no adnexal lesion is seen.  No fluid is noted within the pelvis.  There are a few scattered rectosigmoid colonic diverticula present.  IMPRESSION:  1.  Moderate right hydronephrosis most likely due to edema caused by a 3 mm calculus which appears to have passed into the urinary bladder layering dependently within the urinary bladder. 2.  Right renal calculus as noted above. 3.  Stable appearance of the ground-glass opacity in the right middle lobe.  Consider follow-up in 1 year.   Original Report Authenticated By: Dwyane Dee, M.D.    Dg Abd Acute W/chest  11/13/2012  *RADIOLOGY REPORT*  Clinical Data: Right lower abdominal pain  ACUTE ABDOMEN SERIES (ABDOMEN 2 VIEW & CHEST 1 VIEW)  Comparison: Chest radiograph dated 06/07/2012  Findings: Chronic interstitial markings.  Mild patchy left basilar opacity, likely atelectasis. No pleural effusion or pneumothorax.  Mild cardiomegaly.  Nonspecific bowel gas pattern without disproportionate small bowel dilatation to suggest small bowel obstruction.  No evidence of free air under the diaphragm on the upright view.  Moderate stool in the right colon.  Surgical clips overlying the right sacrum.  IMPRESSION: Mild left basilar opacity, likely atelectasis.  No  evidence of small bowel obstruction or free air.  Moderate stool in the right colon.   Original Report Authenticated By: Charline Bills, M.D.      1. Kidney stone on right side       MDM  Pt improved after treatment for her kidney stone.  Will dc home on oral pain meds, urology follow up as needed.        Celene Kras, MD 11/13/12 [phone]

## 2012-11-13 NOTE — ED Notes (Signed)
[id number]> Expected date:<BR> Expected time:<BR> Means of arrival:<BR> Comments:<BR> F, RLQ pain

## 2012-11-13 NOTE — ED Notes (Signed)
Patient transported to X-ray 

## 2012-11-13 NOTE — ED Notes (Addendum)
Per EMS: pt c/o of RLQ abdominal pain that started abruptly today with nausea. Tender upon palpation. 18 g in left hand 4 mg of zofran.

## 2012-11-22 DIAGNOSIS — E785 Hyperlipidemia, unspecified: Secondary | ICD-10-CM | POA: Diagnosis not present

## 2012-11-22 DIAGNOSIS — Z7901 Long term (current) use of anticoagulants: Secondary | ICD-10-CM | POA: Diagnosis not present

## 2012-11-22 DIAGNOSIS — N951 Menopausal and female climacteric states: Secondary | ICD-10-CM | POA: Diagnosis not present

## 2012-11-22 DIAGNOSIS — M949 Disorder of cartilage, unspecified: Secondary | ICD-10-CM | POA: Diagnosis not present

## 2012-11-22 DIAGNOSIS — M899 Disorder of bone, unspecified: Secondary | ICD-10-CM | POA: Diagnosis not present

## 2012-11-26 DIAGNOSIS — R350 Frequency of micturition: Secondary | ICD-10-CM | POA: Diagnosis not present

## 2012-11-26 DIAGNOSIS — N3941 Urge incontinence: Secondary | ICD-10-CM | POA: Diagnosis not present

## 2013-01-03 DIAGNOSIS — Z7901 Long term (current) use of anticoagulants: Secondary | ICD-10-CM | POA: Diagnosis not present

## 2013-01-04 DIAGNOSIS — H9209 Otalgia, unspecified ear: Secondary | ICD-10-CM | POA: Diagnosis not present

## 2013-01-04 DIAGNOSIS — H6120 Impacted cerumen, unspecified ear: Secondary | ICD-10-CM | POA: Diagnosis not present

## 2013-01-17 DIAGNOSIS — I872 Venous insufficiency (chronic) (peripheral): Secondary | ICD-10-CM | POA: Diagnosis not present

## 2013-01-17 DIAGNOSIS — Z7901 Long term (current) use of anticoagulants: Secondary | ICD-10-CM | POA: Diagnosis not present

## 2013-02-15 DIAGNOSIS — Z7901 Long term (current) use of anticoagulants: Secondary | ICD-10-CM | POA: Diagnosis not present

## 2013-03-14 DIAGNOSIS — Z7901 Long term (current) use of anticoagulants: Secondary | ICD-10-CM | POA: Diagnosis not present

## 2013-03-18 DIAGNOSIS — M47812 Spondylosis without myelopathy or radiculopathy, cervical region: Secondary | ICD-10-CM | POA: Diagnosis not present

## 2013-03-18 DIAGNOSIS — M542 Cervicalgia: Secondary | ICD-10-CM | POA: Diagnosis not present

## 2013-04-02 DIAGNOSIS — M47812 Spondylosis without myelopathy or radiculopathy, cervical region: Secondary | ICD-10-CM | POA: Diagnosis not present

## 2013-04-03 ENCOUNTER — Other Ambulatory Visit: Payer: Self-pay

## 2013-04-11 DIAGNOSIS — N318 Other neuromuscular dysfunction of bladder: Secondary | ICD-10-CM | POA: Diagnosis not present

## 2013-04-11 DIAGNOSIS — Z7901 Long term (current) use of anticoagulants: Secondary | ICD-10-CM | POA: Diagnosis not present

## 2013-04-11 DIAGNOSIS — I872 Venous insufficiency (chronic) (peripheral): Secondary | ICD-10-CM | POA: Diagnosis not present

## 2013-04-11 DIAGNOSIS — I10 Essential (primary) hypertension: Secondary | ICD-10-CM | POA: Diagnosis not present

## 2013-05-15 ENCOUNTER — Other Ambulatory Visit: Payer: Self-pay | Admitting: Physical Medicine and Rehabilitation

## 2013-05-15 DIAGNOSIS — M542 Cervicalgia: Secondary | ICD-10-CM | POA: Diagnosis not present

## 2013-05-15 DIAGNOSIS — M1990 Unspecified osteoarthritis, unspecified site: Secondary | ICD-10-CM

## 2013-05-17 DIAGNOSIS — Z7901 Long term (current) use of anticoagulants: Secondary | ICD-10-CM | POA: Diagnosis not present

## 2013-05-24 ENCOUNTER — Ambulatory Visit
Admission: RE | Admit: 2013-05-24 | Discharge: 2013-05-24 | Disposition: A | Discharge status: Home / Self Care | Payer: Medicare Other | Source: Ambulatory Visit | Attending: Physical Medicine and Rehabilitation | Admitting: Physical Medicine and Rehabilitation

## 2013-05-24 DIAGNOSIS — M5030 Other cervical disc degeneration, unspecified cervical region: Secondary | ICD-10-CM | POA: Diagnosis not present

## 2013-05-24 DIAGNOSIS — M1990 Unspecified osteoarthritis, unspecified site: Secondary | ICD-10-CM

## 2013-05-24 DIAGNOSIS — M47812 Spondylosis without myelopathy or radiculopathy, cervical region: Secondary | ICD-10-CM | POA: Diagnosis not present

## 2013-05-24 DIAGNOSIS — M4802 Spinal stenosis, cervical region: Secondary | ICD-10-CM | POA: Diagnosis not present

## 2013-06-10 ENCOUNTER — Other Ambulatory Visit: Payer: Self-pay | Admitting: Orthopedic Surgery

## 2013-06-10 DIAGNOSIS — M542 Cervicalgia: Secondary | ICD-10-CM

## 2013-06-24 ENCOUNTER — Emergency Department (HOSPITAL_BASED_OUTPATIENT_CLINIC_OR_DEPARTMENT_OTHER): Payer: Medicare Other

## 2013-06-24 ENCOUNTER — Emergency Department (HOSPITAL_BASED_OUTPATIENT_CLINIC_OR_DEPARTMENT_OTHER)
Admission: EM | Admit: 2013-06-24 | Discharge: 2013-06-24 | Disposition: A | Discharge status: Home / Self Care | Payer: Medicare Other | Attending: Emergency Medicine | Admitting: Emergency Medicine

## 2013-06-24 ENCOUNTER — Encounter (HOSPITAL_BASED_OUTPATIENT_CLINIC_OR_DEPARTMENT_OTHER): Payer: Self-pay | Admitting: Emergency Medicine

## 2013-06-24 DIAGNOSIS — Z7901 Long term (current) use of anticoagulants: Secondary | ICD-10-CM | POA: Diagnosis not present

## 2013-06-24 DIAGNOSIS — Z872 Personal history of diseases of the skin and subcutaneous tissue: Secondary | ICD-10-CM | POA: Insufficient documentation

## 2013-06-24 DIAGNOSIS — I10 Essential (primary) hypertension: Secondary | ICD-10-CM | POA: Diagnosis not present

## 2013-06-24 DIAGNOSIS — F329 Major depressive disorder, single episode, unspecified: Secondary | ICD-10-CM | POA: Diagnosis not present

## 2013-06-24 DIAGNOSIS — Z87891 Personal history of nicotine dependence: Secondary | ICD-10-CM | POA: Insufficient documentation

## 2013-06-24 DIAGNOSIS — Z862 Personal history of diseases of the blood and blood-forming organs and certain disorders involving the immune mechanism: Secondary | ICD-10-CM | POA: Diagnosis not present

## 2013-06-24 DIAGNOSIS — F3289 Other specified depressive episodes: Secondary | ICD-10-CM | POA: Insufficient documentation

## 2013-06-24 DIAGNOSIS — Y9389 Activity, other specified: Secondary | ICD-10-CM | POA: Insufficient documentation

## 2013-06-24 DIAGNOSIS — Z79899 Other long term (current) drug therapy: Secondary | ICD-10-CM | POA: Diagnosis not present

## 2013-06-24 DIAGNOSIS — S5012XA Contusion of left forearm, initial encounter: Secondary | ICD-10-CM

## 2013-06-24 DIAGNOSIS — S5010XA Contusion of unspecified forearm, initial encounter: Secondary | ICD-10-CM | POA: Diagnosis not present

## 2013-06-24 DIAGNOSIS — Z8669 Personal history of other diseases of the nervous system and sense organs: Secondary | ICD-10-CM | POA: Insufficient documentation

## 2013-06-24 DIAGNOSIS — S59909A Unspecified injury of unspecified elbow, initial encounter: Secondary | ICD-10-CM | POA: Diagnosis not present

## 2013-06-24 DIAGNOSIS — M25539 Pain in unspecified wrist: Secondary | ICD-10-CM | POA: Diagnosis not present

## 2013-06-24 DIAGNOSIS — W1809XA Striking against other object with subsequent fall, initial encounter: Secondary | ICD-10-CM | POA: Insufficient documentation

## 2013-06-24 DIAGNOSIS — M129 Arthropathy, unspecified: Secondary | ICD-10-CM | POA: Diagnosis not present

## 2013-06-24 DIAGNOSIS — I82409 Acute embolism and thrombosis of unspecified deep veins of unspecified lower extremity: Secondary | ICD-10-CM | POA: Diagnosis not present

## 2013-06-24 DIAGNOSIS — M79609 Pain in unspecified limb: Secondary | ICD-10-CM | POA: Diagnosis not present

## 2013-06-24 DIAGNOSIS — Y929 Unspecified place or not applicable: Secondary | ICD-10-CM | POA: Insufficient documentation

## 2013-06-24 MED ORDER — HYDROCODONE-ACETAMINOPHEN 5-325 MG PO TABS: 1.0000 | ORAL_TABLET | ORAL | Status: DC | PRN

## 2013-06-24 MED ORDER — HYDROCODONE-ACETAMINOPHEN 5-325 MG PO TABS
2.0000 | ORAL_TABLET | Freq: Once | ORAL | Status: AC
  Administered 2013-06-24: 2 via ORAL
  Filled 2013-06-24: qty 2

## 2013-06-24 NOTE — ED Provider Notes (Signed)
CSN: [id number]     Arrival date & time 06/24/13  1010 History   First MD Initiated Contact with Patient 06/24/13 1104     Chief Complaint  Patient presents with  . Arm Injury   (Consider location/radiation/quality/duration/timing/severity/associated sxs/prior Treatment) HPI 78 year old female who fell off the end of a bench yesterday striking her left forearm. She has pain and swelling in the left forearm. It then became swollen immediately. She has been on Coumadin but was switched to Lovenox on Saturday in preparation for a surgical procedure tomorrow. She denies striking her head or loss of consciousness. She was going to sit on the end of the bench and fell forward her right hand but sat partially off of it and fell to the ground. She does not think that she has any other injuries. She has been up and walking since that time. Her tetanus status is up-to-date. She was able to get up with assistance. There is note and the skin. There is no numbness, tingling, or weakness distal to the injury. She has no pain or tenderness to the upper arm, shoulder, upper chest, or neck. Past Medical History  Diagnosis Date  . Hypertension   . Cellulitis   . Cataract   . Arthritis   . Clotting disorder     Previous blood clot in leg  . Depression    Past Surgical History  Procedure Laterality Date  . Pelvic laparoscopy  2012    BORDERLINE OVARIAN TUMOR SEROUS RIGHT  . Cataract extraction    . Foot surgery    . Varicose veins    . Oophorectomy  2012    BSO  ENDOSALPINGIOSIS  . Abdominal surgery  2012    UMBILICAL HERNIA   Family History  Problem Relation Age of Onset  . Heart disease Mother   . Diabetes Brother   . Hypertension Brother   . Breast cancer Daughter   . Hypertension Daughter   . Cancer Daughter     breast and liver   History  Substance Use Topics  . Smoking status: Former Smoker -- 0.50 packs/day    Types: Cigarettes  . Smokeless tobacco: Not on file  . Alcohol Use: No     OB History   Grav Para Term Preterm Abortions TAB SAB Ect Mult Living   4 4        4      Review of Systems  All other systems reviewed and are negative.    Allergies  Morphine and related  Home Medications   Current Outpatient Rx  Name  Route  Sig  Dispense  Refill  . albuterol (PROVENTIL HFA;VENTOLIN HFA) 108 (90 BASE) MCG/ACT inhaler   Inhalation   Inhale 2 puffs into the lungs every 6 (six) hours as needed for wheezing or shortness of breath.   1 Inhaler   1   . amLODipine (NORVASC) 5 MG tablet   Oral   Take 5 mg by mouth every morning.          . cephALEXin (KEFLEX) 500 MG capsule   Oral   Take 500 mg by mouth 3 (three) times daily.         . cyclobenzaprine (FLEXERIL) 5 MG tablet   Oral   Take 5 mg by mouth 2 (two) times daily as needed. For spasms         . docusate sodium (COLACE) 100 MG capsule   Oral   Take 1 capsule (100 mg total) by mouth 2 (two)  times daily as needed for constipation.   14 capsule   0     (the percocet may cause constipation)   . fesoterodine (TOVIAZ) 8 MG TB24   Oral   Take 8 mg by mouth daily.         Marland Kitchen HYDROcodone-acetaminophen (NORCO/VICODIN) 5-325 MG per tablet   Oral   Take 1 tablet by mouth every 4 (four) hours as needed for pain.   15 tablet   0   . olmesartan-hydrochlorothiazide (BENICAR HCT) 40-25 MG per tablet   Oral   Take 1 tablet by mouth every morning.          . ondansetron (ZOFRAN ODT) 8 MG disintegrating tablet   Oral   Take 1 tablet (8 mg total) by mouth every 8 (eight) hours as needed for nausea.   20 tablet   0   . oxyCODONE-acetaminophen (PERCOCET/ROXICET) 5-325 MG per tablet   Oral   Take 1-2 tablets by mouth every 6 (six) hours as needed for pain.   30 tablet   0   . traMADol (ULTRAM) 50 MG tablet   Oral   Take 1 tablet (50 mg total) by mouth every 6 (six) hours as needed.   30 tablet   1   . venlafaxine (EFFEXOR XR) 75 MG 24 hr capsule   Oral   Take 225 mg by mouth every  morning.          . warfarin (COUMADIN) 5 MG tablet   Oral   Take 5 mg by mouth every evening.          BP 165/93  Pulse 93  Temp(Src) 98.7 F (37.1 C) (Oral)  Resp 24  SpO2 93% Physical Exam  Nursing note and vitals reviewed. Constitutional: She is oriented to person, place, and time. She appears well-developed and well-nourished.  HENT:  Head: Normocephalic and atraumatic.  Right Ear: External ear normal.  Left Ear: External ear normal.  Nose: Nose normal.  Mouth/Throat: Oropharynx is clear and moist.  Eyes: Conjunctivae and EOM are normal. Pupils are equal, round, and reactive to light.  Neck: Neck supple.  Cardiovascular: Normal rate and regular rhythm.   Pulmonary/Chest: Effort normal and breath sounds normal.  Abdominal: Soft. Bowel sounds are normal.  Musculoskeletal: She exhibits tenderness.       Arms: Radial pulses 2+. No tenderness over the wrist, hand, elbow, humerus, shoulder, or clavicle. She has a large hematoma of the mid left forearm.  Neurological: She is alert and oriented to person, place, and time.  Skin: Skin is warm and dry.  Psychiatric: She has a normal mood and affect. Her behavior is normal. Judgment and thought content normal.    ED Course  Procedures (including critical care time) Labs Review Labs Reviewed - No data to display Imaging Review Dg Forearm Left  06/24/2013   CLINICAL DATA:  Recent traumatic injury with pain  EXAM: LEFT FOREARM - 2 VIEW  COMPARISON:  None.  FINDINGS: Significant soft tissue swelling is noted in the proximal forearm consistent with a hematoma from patient's recent injury. No acute fracture is noted. The lunate is somewhat tilted posteriorly of uncertain significance. Wrist films may be helpful in this regard.  IMPRESSION: Soft tissue swelling in the proximal forearm. No fracture of the radius and ulna.  Questionable changes of the lunate. The need for wrist films can be determined on a clinical basis.    Electronically Signed   By: Alcide Clever M.D.   On:  06/24/2013 11:51   Dg Wrist Complete Left  06/24/2013   CLINICAL DATA:  Fall, left wrist pain  EXAM: LEFT WRIST - COMPLETE 3+ VIEW  COMPARISON:  05/24/2007  FINDINGS: No fracture or dislocation is seen.  Degenerative changes along the radial aspect of the carpus. Mild radiocarpal narrowing.  Visualized soft tissues are grossly unremarkable.  IMPRESSION: No fracture or dislocation is seen.  Degenerative changes of the wrist.   Electronically Signed   By: Charline Bills M.D.   On: 06/24/2013 12:28    EKG Interpretation   None       MDM   1. Traumatic hematoma of left forearm    Patient with accidental fall and hematoma of the left forearm yesterday. She is on anticoagulant therapy. She's not appear to have a fracture. Patient is given an Ace wrap and instructed on rest ice compression and elevation. She is given return precautions especially regarding compartment syndrome.    Hilario Quarry, MD 06/24/13 1329

## 2013-06-24 NOTE — ED Notes (Signed)
Patient states she was sitting down yesterday and missed her seat and fell.  Landed on her right arm.  Large amount of swelling in the right forearm. Neurovascular wnl.  Strong pulse, warm to touch and normal sensation.  Pt has been taking warfarin for a history of PE.  States on two days ago, she switched to lovenox for a pending cervical surgery.  Denies loc.

## 2013-06-25 ENCOUNTER — Ambulatory Visit
Admission: RE | Admit: 2013-06-25 | Discharge: 2013-06-25 | Disposition: A | Discharge status: Home / Self Care | Payer: Medicare Other | Source: Ambulatory Visit | Attending: Orthopedic Surgery | Admitting: Orthopedic Surgery

## 2013-06-25 ENCOUNTER — Other Ambulatory Visit: Payer: Medicare Other

## 2013-06-25 DIAGNOSIS — M542 Cervicalgia: Secondary | ICD-10-CM

## 2013-06-25 MED ORDER — DEXAMETHASONE SODIUM PHOSPHATE 4 MG/ML IJ SOLN: 5.0000 mg | Freq: Once | INTRAMUSCULAR | Status: DC

## 2013-06-25 MED ORDER — IOHEXOL 300 MG/ML  SOLN: 1.0000 mL | Freq: Once | INTRAMUSCULAR | Status: AC | PRN

## 2013-06-27 DIAGNOSIS — S40029A Contusion of unspecified upper arm, initial encounter: Secondary | ICD-10-CM | POA: Diagnosis not present

## 2013-06-27 DIAGNOSIS — I82409 Acute embolism and thrombosis of unspecified deep veins of unspecified lower extremity: Secondary | ICD-10-CM | POA: Diagnosis not present

## 2013-06-27 DIAGNOSIS — Z23 Encounter for immunization: Secondary | ICD-10-CM | POA: Diagnosis not present

## 2013-06-27 DIAGNOSIS — Z7901 Long term (current) use of anticoagulants: Secondary | ICD-10-CM | POA: Diagnosis not present

## 2013-07-01 DIAGNOSIS — Z7901 Long term (current) use of anticoagulants: Secondary | ICD-10-CM | POA: Diagnosis not present

## 2013-07-01 DIAGNOSIS — I82409 Acute embolism and thrombosis of unspecified deep veins of unspecified lower extremity: Secondary | ICD-10-CM | POA: Diagnosis not present

## 2013-07-04 ENCOUNTER — Other Ambulatory Visit: Payer: Self-pay

## 2013-07-04 DIAGNOSIS — S61409A Unspecified open wound of unspecified hand, initial encounter: Secondary | ICD-10-CM | POA: Diagnosis not present

## 2013-08-15 DIAGNOSIS — I82409 Acute embolism and thrombosis of unspecified deep veins of unspecified lower extremity: Secondary | ICD-10-CM | POA: Diagnosis not present

## 2013-08-15 DIAGNOSIS — Z7901 Long term (current) use of anticoagulants: Secondary | ICD-10-CM | POA: Diagnosis not present

## 2013-09-04 DIAGNOSIS — S61409A Unspecified open wound of unspecified hand, initial encounter: Secondary | ICD-10-CM | POA: Diagnosis not present

## 2013-09-06 DIAGNOSIS — S61409A Unspecified open wound of unspecified hand, initial encounter: Secondary | ICD-10-CM | POA: Diagnosis not present

## 2013-09-09 DIAGNOSIS — Z7901 Long term (current) use of anticoagulants: Secondary | ICD-10-CM | POA: Diagnosis not present

## 2013-09-09 DIAGNOSIS — I82409 Acute embolism and thrombosis of unspecified deep veins of unspecified lower extremity: Secondary | ICD-10-CM | POA: Diagnosis not present

## 2013-10-17 DIAGNOSIS — I10 Essential (primary) hypertension: Secondary | ICD-10-CM | POA: Diagnosis not present

## 2013-10-17 DIAGNOSIS — Z1331 Encounter for screening for depression: Secondary | ICD-10-CM | POA: Diagnosis not present

## 2013-10-17 DIAGNOSIS — Z7901 Long term (current) use of anticoagulants: Secondary | ICD-10-CM | POA: Diagnosis not present

## 2013-10-17 DIAGNOSIS — E785 Hyperlipidemia, unspecified: Secondary | ICD-10-CM | POA: Diagnosis not present

## 2013-10-17 DIAGNOSIS — Z23 Encounter for immunization: Secondary | ICD-10-CM | POA: Diagnosis not present

## 2013-10-17 DIAGNOSIS — F329 Major depressive disorder, single episode, unspecified: Secondary | ICD-10-CM | POA: Diagnosis not present

## 2013-10-17 DIAGNOSIS — M5030 Other cervical disc degeneration, unspecified cervical region: Secondary | ICD-10-CM | POA: Diagnosis not present

## 2013-11-14 DIAGNOSIS — I82409 Acute embolism and thrombosis of unspecified deep veins of unspecified lower extremity: Secondary | ICD-10-CM | POA: Diagnosis not present

## 2013-11-14 DIAGNOSIS — M542 Cervicalgia: Secondary | ICD-10-CM | POA: Diagnosis not present

## 2013-11-14 DIAGNOSIS — E785 Hyperlipidemia, unspecified: Secondary | ICD-10-CM | POA: Diagnosis not present

## 2013-11-14 DIAGNOSIS — F325 Major depressive disorder, single episode, in full remission: Secondary | ICD-10-CM | POA: Diagnosis not present

## 2013-11-14 DIAGNOSIS — Z7901 Long term (current) use of anticoagulants: Secondary | ICD-10-CM | POA: Diagnosis not present

## 2013-11-27 DIAGNOSIS — N390 Urinary tract infection, site not specified: Secondary | ICD-10-CM | POA: Diagnosis not present

## 2013-11-27 DIAGNOSIS — N3941 Urge incontinence: Secondary | ICD-10-CM | POA: Diagnosis not present

## 2013-12-02 DIAGNOSIS — Z7901 Long term (current) use of anticoagulants: Secondary | ICD-10-CM | POA: Diagnosis not present

## 2013-12-02 DIAGNOSIS — I82409 Acute embolism and thrombosis of unspecified deep veins of unspecified lower extremity: Secondary | ICD-10-CM | POA: Diagnosis not present

## 2014-01-03 DIAGNOSIS — Z7901 Long term (current) use of anticoagulants: Secondary | ICD-10-CM | POA: Diagnosis not present

## 2014-01-03 DIAGNOSIS — J209 Acute bronchitis, unspecified: Secondary | ICD-10-CM | POA: Diagnosis not present

## 2014-01-03 DIAGNOSIS — I82409 Acute embolism and thrombosis of unspecified deep veins of unspecified lower extremity: Secondary | ICD-10-CM | POA: Diagnosis not present

## 2014-01-06 DIAGNOSIS — Z5181 Encounter for therapeutic drug level monitoring: Secondary | ICD-10-CM | POA: Diagnosis not present

## 2014-01-06 DIAGNOSIS — Z7901 Long term (current) use of anticoagulants: Secondary | ICD-10-CM | POA: Diagnosis not present

## 2014-01-21 DIAGNOSIS — I82409 Acute embolism and thrombosis of unspecified deep veins of unspecified lower extremity: Secondary | ICD-10-CM | POA: Diagnosis not present

## 2014-01-21 DIAGNOSIS — Z7901 Long term (current) use of anticoagulants: Secondary | ICD-10-CM | POA: Diagnosis not present

## 2014-02-14 DIAGNOSIS — Z5181 Encounter for therapeutic drug level monitoring: Secondary | ICD-10-CM | POA: Diagnosis not present

## 2014-02-14 DIAGNOSIS — M47812 Spondylosis without myelopathy or radiculopathy, cervical region: Secondary | ICD-10-CM | POA: Diagnosis not present

## 2014-02-14 DIAGNOSIS — M542 Cervicalgia: Secondary | ICD-10-CM | POA: Diagnosis not present

## 2014-02-14 DIAGNOSIS — Z7901 Long term (current) use of anticoagulants: Secondary | ICD-10-CM | POA: Diagnosis not present

## 2014-03-19 ENCOUNTER — Other Ambulatory Visit: Payer: Self-pay

## 2014-03-19 ENCOUNTER — Telehealth: Payer: Self-pay

## 2014-03-19 NOTE — Telephone Encounter (Signed)
I received three pages from [place] each requesting a new Rx for the patient. One was for Toviaz and the other two did not look like she has received here-Tolteradine and Bupropion XL.  However, patient's last visit was with Dr. Cherylann Banas in May 2013 so it has been >2 years since she was last seen in the office.  I faxed the requests back with a note telling them that no Rx's will be prescribed without office visit.

## 2014-03-23 IMAGING — CR DG FOREARM 2V*L*
1 series · 1 of 1 positions shown · non-contrast
Comparison: None.

CLINICAL DATA: Recent traumatic injury with pain

EXAM:
LEFT FOREARM - 2 VIEW

[x forearm ap left]
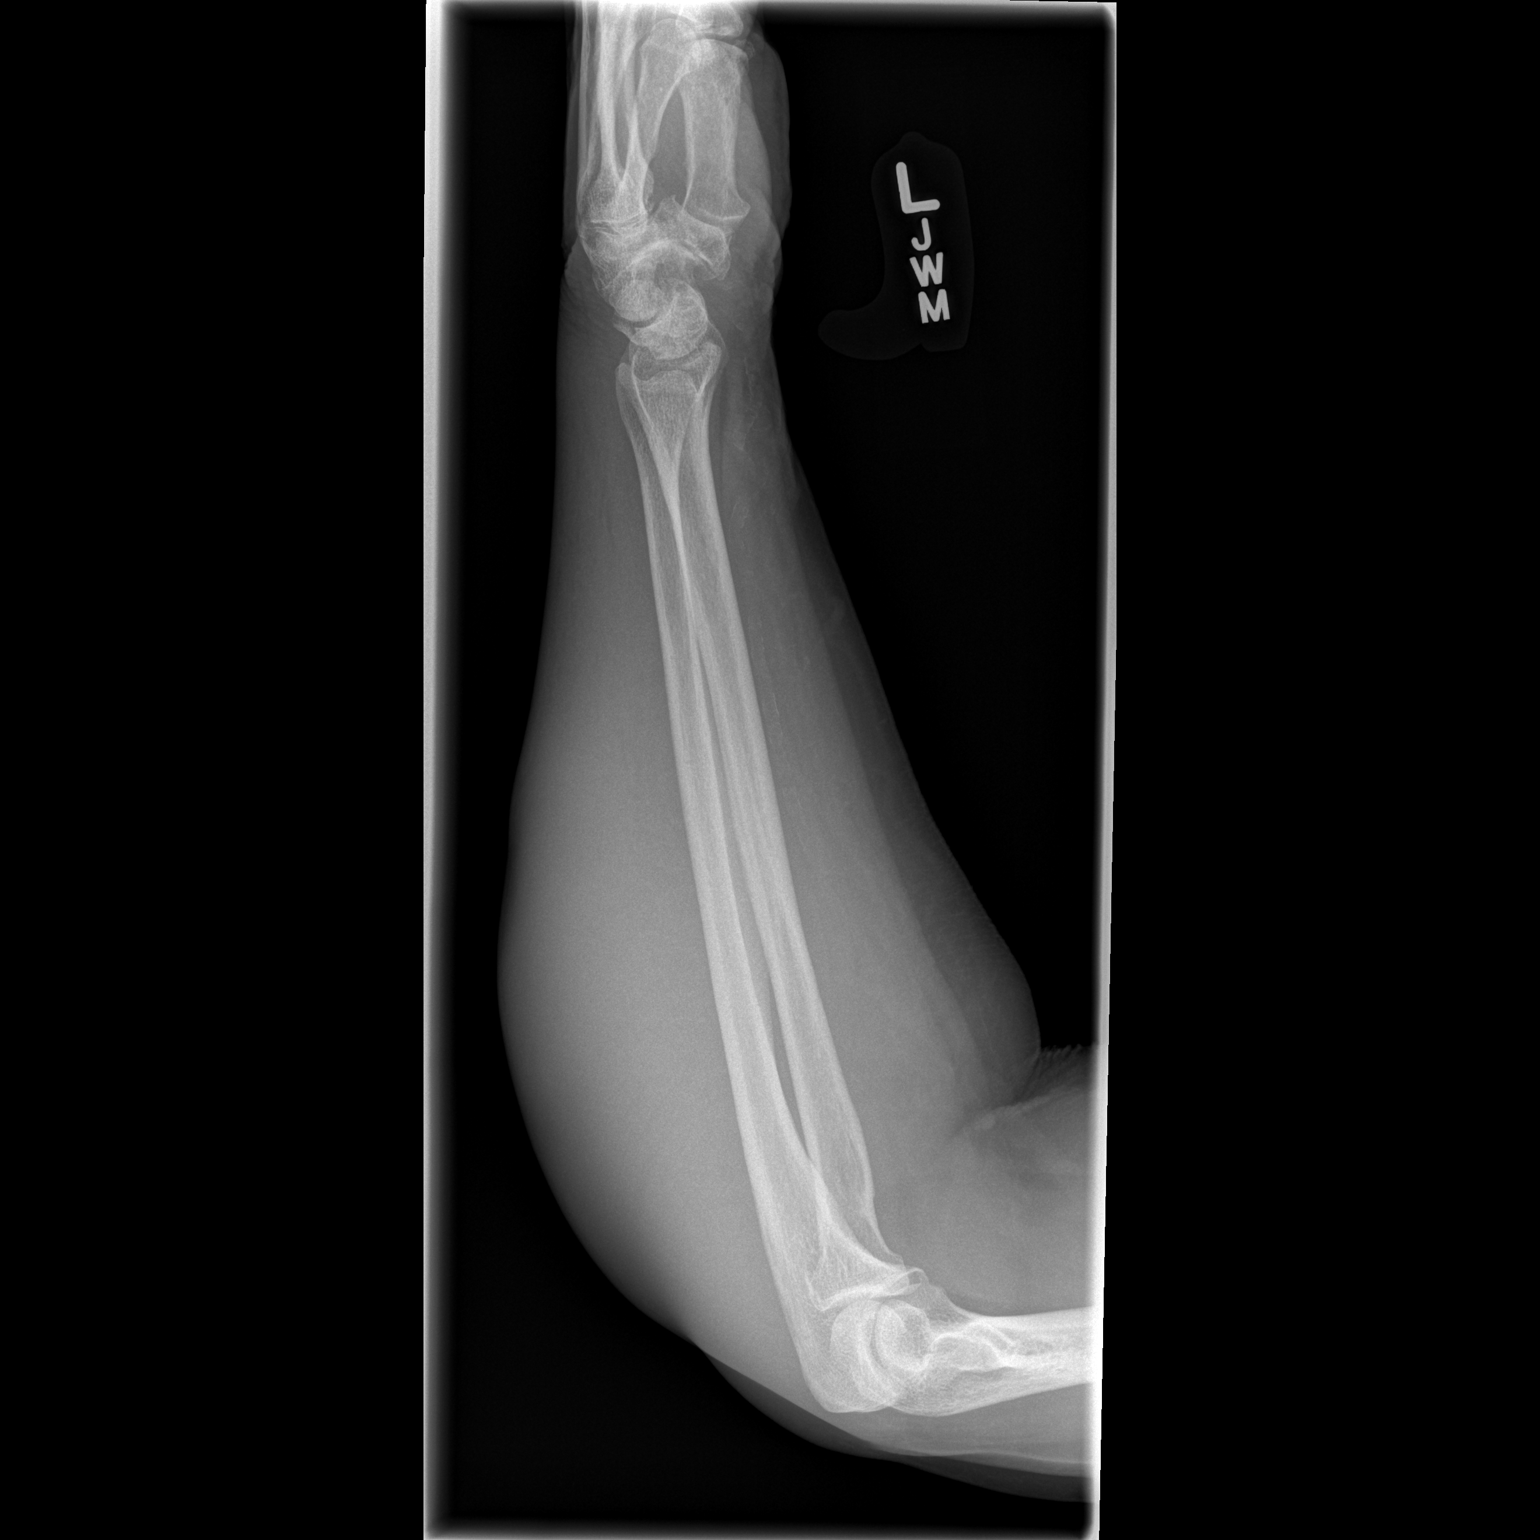

[1 of 1 positions shown; findings below may reference images not displayed]

FINDINGS: Significant soft tissue swelling is noted in the proximal forearm
consistent with a hematoma from patient's recent injury. No acute
fracture is noted. The lunate is somewhat tilted posteriorly of
uncertain significance. Wrist films may be helpful in this regard.
IMPRESSION: Soft tissue swelling in the proximal forearm. No fracture of the
radius and ulna.

Questionable changes of the lunate. The need for wrist films can be
determined on a clinical basis.

## 2014-03-23 IMAGING — CR DG WRIST COMPLETE 3+V*L*
4 series · 4 of 4 positions shown · non-contrast
Comparison: 05/24/2007

CLINICAL DATA: Fall, left wrist pain

EXAM:
LEFT WRIST - COMPLETE 3+ VIEW

[x wrist pa left]
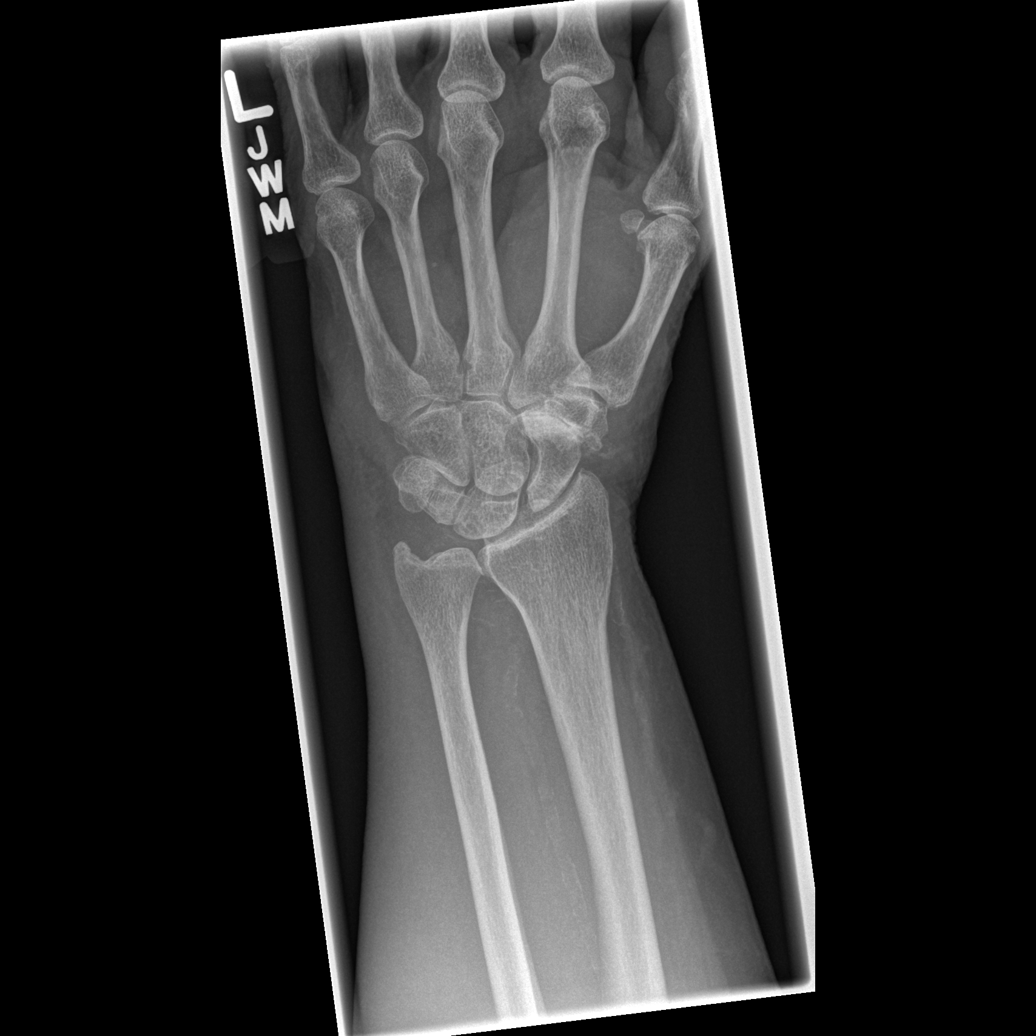

[x wrist obl left]
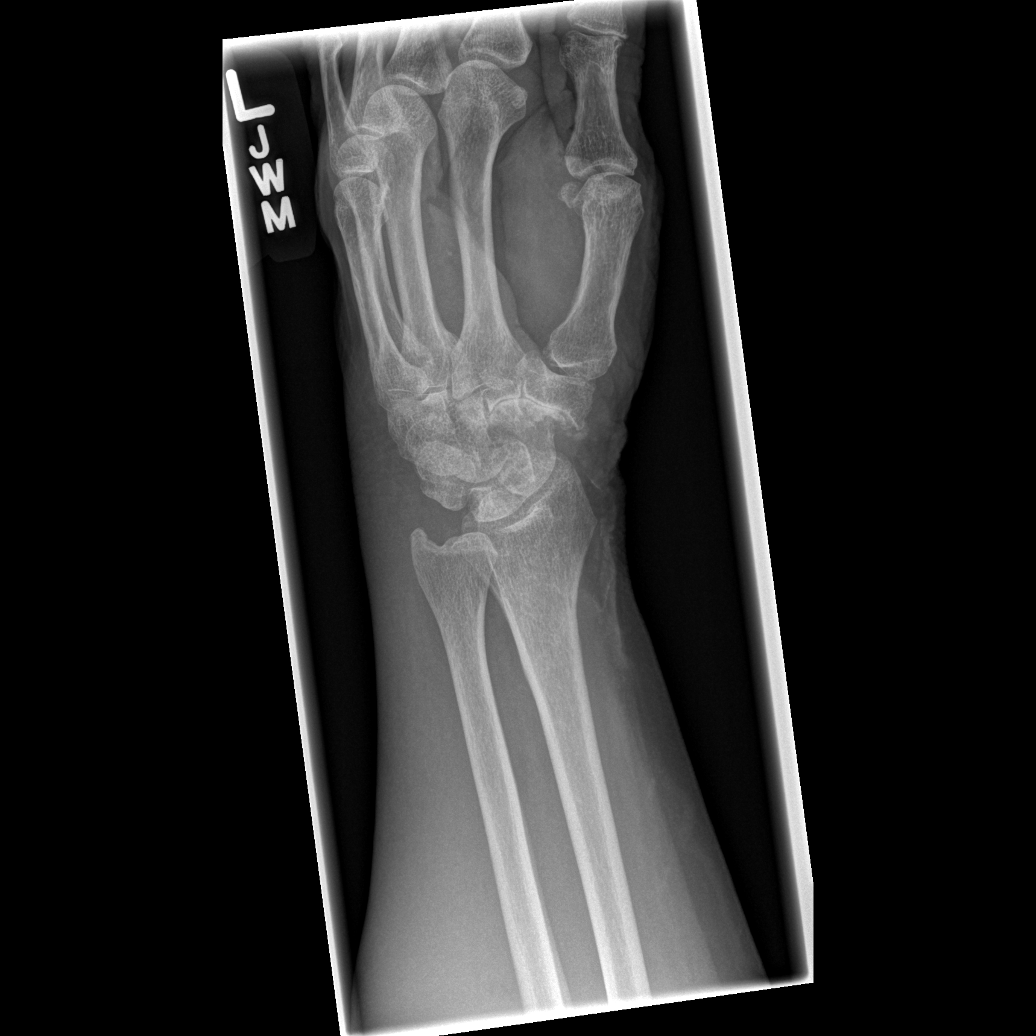

[x wrist lat left]
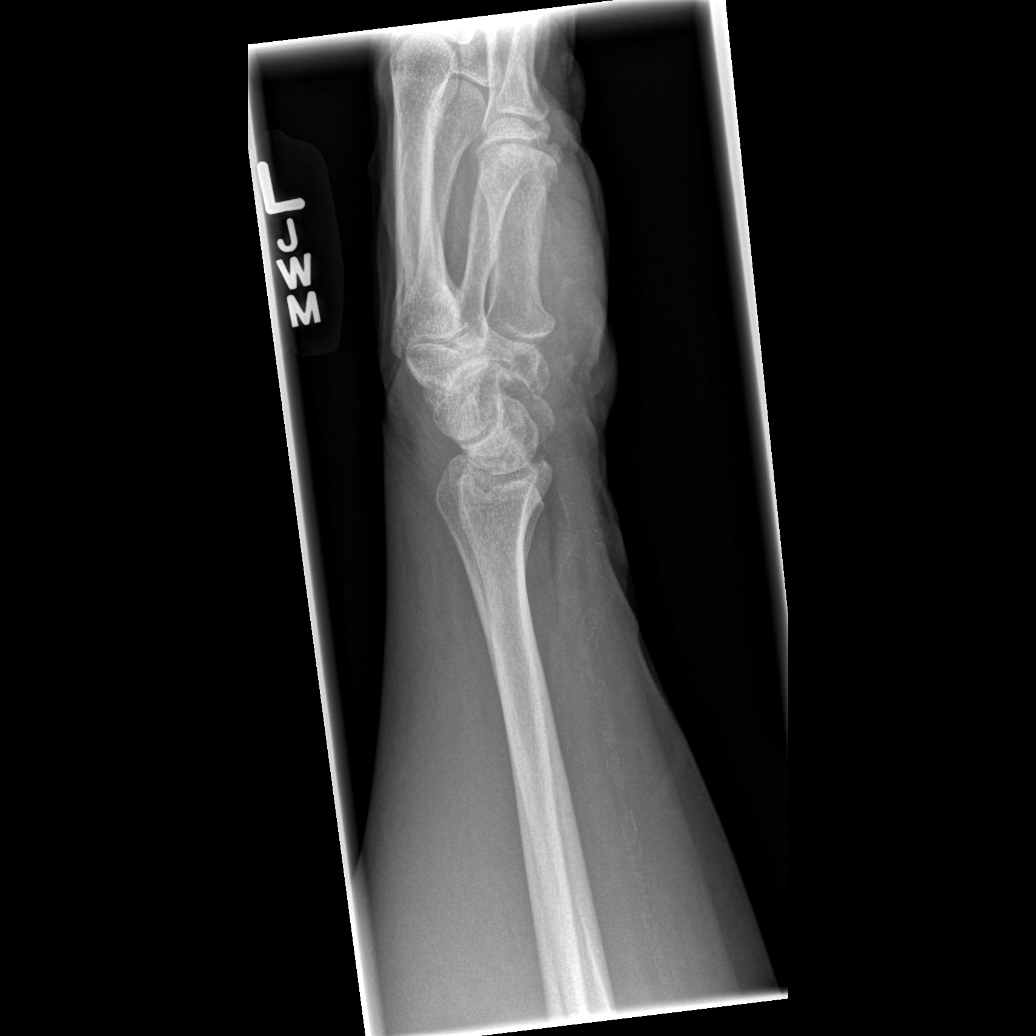

[x navicular]
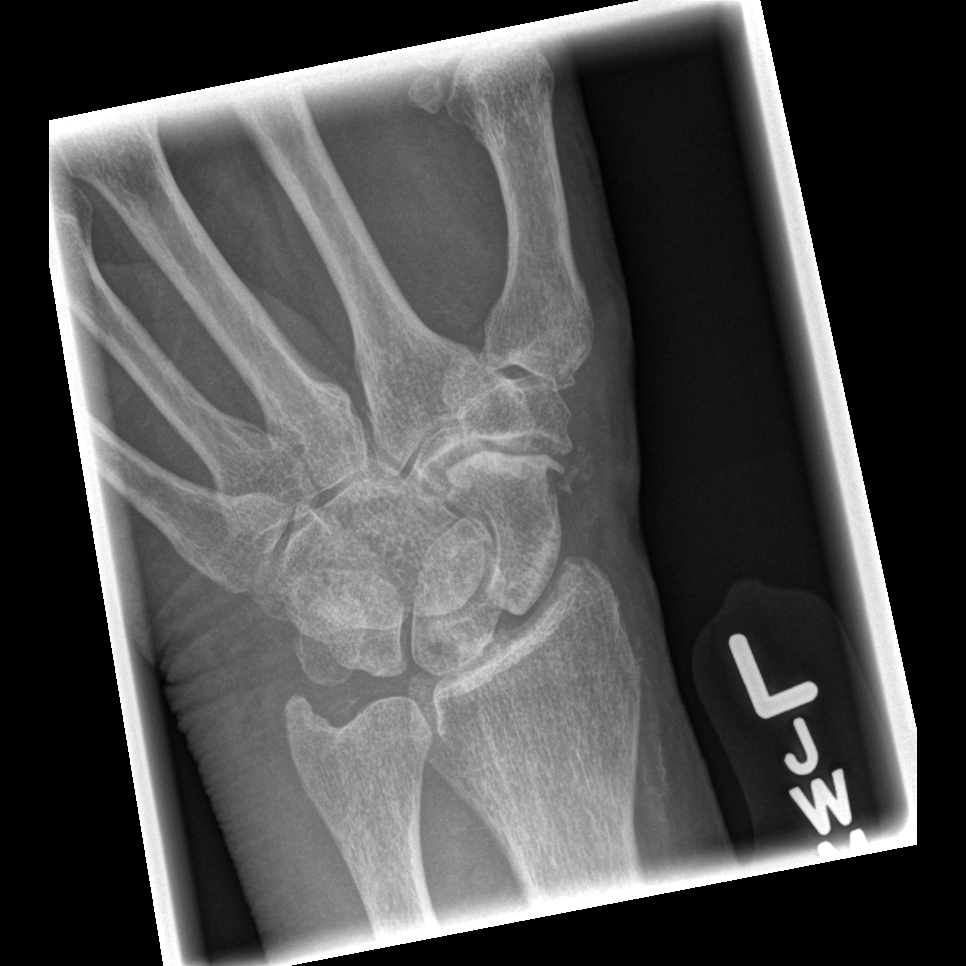

[4 of 4 positions shown; findings below may reference images not displayed]

FINDINGS: No fracture or dislocation is seen.

Degenerative changes along the radial aspect of the carpus. Mild
radiocarpal narrowing.

Visualized soft tissues are grossly unremarkable.
IMPRESSION: No fracture or dislocation is seen.

Degenerative changes of the wrist.

## 2014-05-19 DIAGNOSIS — Z6841 Body Mass Index (BMI) 40.0 and over, adult: Secondary | ICD-10-CM | POA: Diagnosis not present

## 2014-05-19 DIAGNOSIS — M5030 Other cervical disc degeneration, unspecified cervical region: Secondary | ICD-10-CM | POA: Diagnosis not present

## 2014-05-19 DIAGNOSIS — F325 Major depressive disorder, single episode, in full remission: Secondary | ICD-10-CM | POA: Diagnosis not present

## 2014-05-19 DIAGNOSIS — Z23 Encounter for immunization: Secondary | ICD-10-CM | POA: Diagnosis not present

## 2014-05-19 DIAGNOSIS — I10 Essential (primary) hypertension: Secondary | ICD-10-CM | POA: Diagnosis not present

## 2014-06-13 ENCOUNTER — Other Ambulatory Visit: Payer: Self-pay

## 2014-06-30 ENCOUNTER — Encounter (HOSPITAL_BASED_OUTPATIENT_CLINIC_OR_DEPARTMENT_OTHER): Payer: Self-pay | Admitting: Emergency Medicine

## 2014-07-13 ENCOUNTER — Encounter: Payer: Self-pay | Admitting: *Deleted

## 2014-08-22 ENCOUNTER — Emergency Department: Payer: Medicare Other

## 2014-08-22 ENCOUNTER — Emergency Department
Admission: EM | Admit: 2014-08-22 | Discharge: 2014-08-22 | Disposition: A | Discharge status: Home / Self Care | Payer: Medicare Other | Attending: Emergency Medicine | Admitting: Emergency Medicine

## 2014-08-22 DIAGNOSIS — Z7901 Long term (current) use of anticoagulants: Secondary | ICD-10-CM | POA: Insufficient documentation

## 2014-08-22 DIAGNOSIS — S0181XA Laceration without foreign body of other part of head, initial encounter: Secondary | ICD-10-CM | POA: Insufficient documentation

## 2014-08-22 DIAGNOSIS — Z86711 Personal history of pulmonary embolism: Secondary | ICD-10-CM | POA: Insufficient documentation

## 2014-08-22 DIAGNOSIS — Z23 Encounter for immunization: Secondary | ICD-10-CM | POA: Insufficient documentation

## 2014-08-22 DIAGNOSIS — S5011XA Contusion of right forearm, initial encounter: Secondary | ICD-10-CM

## 2014-08-22 DIAGNOSIS — S51012A Laceration without foreign body of left elbow, initial encounter: Secondary | ICD-10-CM | POA: Insufficient documentation

## 2014-08-22 DIAGNOSIS — S5002XA Contusion of left elbow, initial encounter: Secondary | ICD-10-CM

## 2014-08-22 DIAGNOSIS — I10 Essential (primary) hypertension: Secondary | ICD-10-CM | POA: Insufficient documentation

## 2014-08-22 DIAGNOSIS — W010XXA Fall on same level from slipping, tripping and stumbling without subsequent striking against object, initial encounter: Secondary | ICD-10-CM | POA: Insufficient documentation

## 2014-08-22 DIAGNOSIS — Z86718 Personal history of other venous thrombosis and embolism: Secondary | ICD-10-CM | POA: Insufficient documentation

## 2014-08-22 DIAGNOSIS — M25522 Pain in left elbow: Secondary | ICD-10-CM | POA: Diagnosis not present

## 2014-08-22 DIAGNOSIS — W0110XA Fall on same level from slipping, tripping and stumbling with subsequent striking against unspecified object, initial encounter: Secondary | ICD-10-CM | POA: Diagnosis not present

## 2014-08-22 DIAGNOSIS — S8002XA Contusion of left knee, initial encounter: Secondary | ICD-10-CM | POA: Diagnosis not present

## 2014-08-22 DIAGNOSIS — S8992XA Unspecified injury of left lower leg, initial encounter: Secondary | ICD-10-CM | POA: Diagnosis not present

## 2014-08-22 DIAGNOSIS — M79631 Pain in right forearm: Secondary | ICD-10-CM | POA: Diagnosis not present

## 2014-08-22 DIAGNOSIS — M25562 Pain in left knee: Secondary | ICD-10-CM | POA: Diagnosis not present

## 2014-08-22 DIAGNOSIS — M1712 Unilateral primary osteoarthritis, left knee: Secondary | ICD-10-CM | POA: Diagnosis not present

## 2014-08-22 DIAGNOSIS — S59902A Unspecified injury of left elbow, initial encounter: Secondary | ICD-10-CM | POA: Diagnosis not present

## 2014-08-22 DIAGNOSIS — S59911A Unspecified injury of right forearm, initial encounter: Secondary | ICD-10-CM | POA: Diagnosis not present

## 2014-08-22 HISTORY — DX: Acute embolism and thrombosis of unspecified deep veins of lower extremity, bilateral: I82.403

## 2014-08-22 HISTORY — DX: Essential (primary) hypertension: I10

## 2014-08-22 HISTORY — DX: Other pulmonary embolism without acute cor pulmonale: I26.99

## 2014-08-22 MED ORDER — ACETAMINOPHEN 500 MG PO TABS
1000.0000 mg | ORAL_TABLET | Freq: Once | ORAL | Status: AC
Start: 2014-08-22 — End: 2014-08-22
  Administered 2014-08-22: 1000 mg via ORAL
  Filled 2014-08-22: qty 2

## 2014-08-22 MED ORDER — TETANUS-DIPHTH-ACELL PERTUSSIS 5-2.5-18.5 LF-MCG/0.5 IM SUSP
0.5000 mL | Freq: Once | INTRAMUSCULAR | Status: AC
Start: 2014-08-22 — End: 2014-08-22
  Administered 2014-08-22: 0.5 mL via INTRAMUSCULAR
  Filled 2014-08-22: qty 0.5

## 2014-08-22 MED ORDER — ACETAMINOPHEN 500 MG PO TABS
500.0000 mg | ORAL_TABLET | Freq: Four times a day (QID) | ORAL | Status: AC | PRN
Start: 2014-08-22 — End: 2014-09-01

## 2014-08-22 NOTE — ED Notes (Signed)
Refer to quick triage note

## 2014-08-22 NOTE — Discharge Instructions (Signed)
Return immediately to the emergency room if you have any worsening symptoms!!! Otherwise, see general doctor in the next 3 days, or return to the emergency department in 3 days if you have any trouble getting an appointment.     Thank you for choosing [facility] for your emergency care needs.  We strive to provide EXCELLENT care to you and your family.      If you do not continue to improve or your condition worsens, please contact your doctor or return immediately to the Emergency Department.    The examination and treatment you have received in our Emergency Department is provided on an emergency basis, and is not intended to be a substitute for your primary care physician.  It is important that your doctor checks you again and that you report any new or remaining problems at that time.      DOCTOR REFERRALS  Call [phone] (available 24 hours a day, 7 days a week) if you need any further referrals and we can help you find a primary care doctor or specialist.  Also, available online at:  [email or web address]    YOUR CONTACT INFORMATION  Before leaving please check with registration to make sure we have an up-to-date contact number.  You can call registration at [phone] to update your information.  For questions about your hospital bill, please call [phone].  For questions about your Emergency Dept Physician bill please call [phone].      FREE HEALTH SERVICES  If you need help with health or social services, please call 2-1-1 for a free referral to resources in your area.  2-1-1 is a free service connecting people with information on health insurance, free clinics, pregnancy, mental health, dental care, food assistance, housing, and substance abuse counseling.  Also, available online at:  [email or web address]    MEDICAL RECORDS AND TESTS  Certain laboratory test results do not come back the same day, for example urine cultures.   We will contact you if  other important findings are noted.  Radiology films are often reviewed again to ensure accuracy.  If there is any discrepancy, we will notify you.      Please call [phone] to pick up a complimentary CD of any radiology studies performed.  If you or your doctor would like to request a copy of your medical records, please call [phone].      ORTHOPEDIC INJURY   Please know that significant injuries can exist even when an initial x-ray is read as normal or negative.  This can occur because some fractures (broken bones) are not initially visible on x-rays.  For this reason, close outpatient follow-up with your primary care doctor or bone specialist (orthopedist) is required.    MEDICATIONS AND FOLLOWUP  Please be aware that some prescription medications can cause drowsiness.  Use caution when driving or operating machinery.    24 HOUR PHARMACIES  [facility] - [address] [place], [place] [address] (1.4 miles, 7 minutes)  [facility] - [address], [place], [place] [address] (6.5 miles, 13 minutes)  A handout with directions is available on request.      Knee Injury, General    You have been seen for a knee injury.    There are a few different kinds of injuries that can happen with the knee. They can be seen separately (individually) or can happen together.    Ligament Injuries:    A sprain is an injury to a ligament (  a type of connective tissue). It is usually a tear or partial tear. Sprains can be as painful as broken bones.   There are different degrees of injury. A first-degree sprain is a minor tear. With a third-degree sprain, there is often a chip in the bone torn away where the ligament attaches.    It can be hard to tell which ligament is injured. This is because of the swelling and pain that happen with some knee injuries.   Sprains are treated with medicine to lower pain and splints to limit movement. They are also treated with Rest, Ice, Compressing and Elevating the injured area  (RICE).    Cartilage Injuries:   Cartilage is the soft, flexible tissue in the body. The knee has a lot of cartilage. It can be damaged from injury or over-use.    Once the cartilage is damaged, surgery may be needed. This is done to take out the injured pieces. A bone doctor decides if a knee injury needs surgery. This doctor is called an orthopedist.   An injury damaging the knee s cartilage often also damages the soft tissues (ligaments) of the knee.     Treatment for a knee injury includes:   REST: Limit the use of the injured body part.     ICE: By applying ice to the affected area, swelling and pain can be reduced. Place some ice cubes in a re-sealable (Ziploc) bag and add some water. Put a thin washcloth between the bag and the skin. Apply the ice bag to the area for at least 20 minutes. Do this at least 4 times per day. It is okay to do this more often than directed. You can also do it for longer than directed. NEVER APPLY ICE DIRECTLY TO THE SKIN.     COMPRESS: Compression means to apply pressure around the injured area such as with a splint, cast or an ACE bandage. Compression lowers swelling and makes the area more comfortable. Compression should be tight enough to relieve swelling but not so tight it lowers circulation. Increasing pain, numbness, tingling, or change in skin color are all signs of decreased circulation.      ELEVATE: Elevate the injured part. You can prop an injured leg on a chair while sitting, or on pillows while lying down.    You may take off the splint when you sleep and bathe. You can also take it off during the day when you are not walking. Bend your knee a few times to help with stiffness when you remove the splint.    YOU SHOULD SEEK MEDICAL ATTENTION IMMEDIATELY, EITHER HERE OR AT THE NEAREST EMERGENCY DEPARTMENT, IF ANY OF THE FOLLOWING OCCUR:     You have a severe increase in pain.   You have new numbness or tingling in or below the affected area.   Your  leg or foot is cold or pale. This could mean it is having problems with its blood supply.   You have a fever (temperature higher than 100.68F or 38C) or chills.   Your knee gets more red or feels warm.    If you can t follow up with your doctor, or if at any time you feel you need to be rechecked or seen again, come back here or go to the nearest emergency department.              Contusion    You have been diagnosed with a contusion.    A contusion is  a bruise. A contusion occurs when something strikes or hits the body. This breaks small blood vessels called capillaries. When the capillaries break, blood leaks out. This makes the skin look red, purple, blue, or black. The injured area may hurt for a few days. If you take a blood thinner like warfarin (Coumadin) the bruising may be worse.    Apply ice to the bruise. Avoid using the injured body part.    Apply ice to help with pain and swelling. Put some ice cubes in a re-sealable plastic bag (like Ziploc). Add some water. Seal the bag. Put a thin washcloth between the bag and the skin. Apply the ice bag for at least 20 minutes. Do this at least 4 times per day. It's okay to apply ice longer or more often. NEVER APPLY ICE DIRECTLY TO THE SKIN. Always keep a washcloth between the ice pack and your body.    You have been given an ACE BANDAGE. The bandage will compress (squeeze) the injured area. This increases comfort and reduces swelling. The ACE bandage should fit snugly but not so tight as to decrease circulation (blood supply). Watch for swelling of the area outside the ACE wrap. Check capillary refill (circulation) in your fingernails or toenails. To do this, press on the nail. It should turn white. When you let go, the nail should return to pink in less than 2 seconds. If it doesn't, the bandage is too tight. Loosen the wrap if you need to.   Wear the ACE bandage as needed for comfort for the next few days.    YOU SHOULD SEEK MEDICAL ATTENTION  IMMEDIATELY, EITHER HERE OR AT THE NEAREST EMERGENCY DEPARTMENT, IF ANY OF THE FOLLOWING OCCURS:   Your pain or swelling gets much worse.   You develop new numbness or tingling in or below the affected area.   Your foot or hand looks cold or pale. This could mean there is a problem with circulation (blood supply).              Hematoma    You have been diagnosed with a hematoma.    A hematoma is a collection of blood under the skin usually caused by injury to the area. In other words, it is a very large bruise.    Put ice on the area 15 minutes out of every hour to help with swelling and pain. Putting ice on the affected area can reduce pain and swelling. Put some ice cubes in a re-sealable (Ziploc) bag. Add some water. Put a thin washcloth between the bag and the skin. Apply the ice bag to the area for at least 20 minutes. Do this at least 4 times per day. Longer times and more often are OK. NEVER APPLY ICE DIRECTLY TO THE SKIN! If your injury is on your hand or foot, lift it above the level of your heart to help with swelling.    YOU SHOULD SEEK MEDICAL ATTENTION IMMEDIATELY, EITHER HERE OR AT THE NEAREST EMERGENCY DEPARTMENT, IF ANY OF THE FOLLOWING OCCURS:   The hematoma keeps getting bigger.   Fever (temperature higher than 100.44F / 38C) or shaking chills.   Redness that surrounds or spreads outward from the area.   Breakdown of the skin in the area affected.   Very bad-smelling drainage from the injured area.              Chin Laceration, Sutures    You have been treated for a laceration (cut).  Follow up with your doctor OR come back here OR go to the nearest Emergency Department to have your sutures (stitches) taken out. Sutures should be taken out in:   3-5 days.    Use the following wound care instructions:   Keep the wound clean and dry for the next 24 hours. You can wash the wound gently with soap and water.   DO NOT allow your wound to soak in water (don't do the dishes or go  swimming, for example). You can shower, but do not rub your stitches too hard. Let the wound dry before putting another bandage on.   Take off old dressings every day. Then put on a clean, dry dressing.   If the bandage sticks to the wound, slightly moisten it with water. This way, it can come off more easily.   You can wash the wound gently with soap and water. To help remove a scab, cleanse the area with a mixture of half hydrogen peroxide and half water. This will also help [place] to take out the sutures later.   Allow the area to dry completely before putting on a new bandage.   Unless you receive instructions not to do so, you can place a thin layer of antibiotic ointment over the wound. You can buy Polysporin, Bacitracin, or Neosporin at the store. Neosporin can sometimes cause irritation to your skin. If this happens, stop using it and switch to another topical (surface) antibiotic.   If needed, put a clean, dry bandage over the wound to protect it.    Keep the affected area elevated (lifted) for the next 24 hours. This will decrease swelling and pain. You may also want to put ice on the area. By applying ice to the affected area, swelling and pain can be reduced. Place some ice cubes in a re-sealable (Ziploc) bag and add some water. Put a thin washcloth between the bag and the skin. Apply the ice bag to the area for at least 20 minutes. Do this at least 4 times per day. It is OK to use the ice more frequently and for longer periods of time. DO NOT APPLY ICE DIRECTLY TO THE SKIN!    If you had a local anesthetic, it will wear off in about 2 hours. Until then, be careful not to hurt yourself because of having less feeling in the area.    YOU SHOULD SEEK MEDICAL ATTENTION IMMEDIATELY, EITHER HERE OR AT THE NEAREST EMERGENCY DEPARTMENT, IF ANY OF THE FOLLOWING OCCURS:   You see redness or swelling.   There are red streaks going up from the injured area.   The wound smells bad or has a lot of  drainage.   You have fever (temperature higher than 100.6F / 38C), chills, worse pain and / or swelling.      Elbow Laceration, Sutures    You have been treated for a laceration (cut).    Follow up with your doctor OR come back here OR go to the nearest Emergency Department to have your sutures (stitches) taken out. Sutures should be taken out in:   7 days.    Use the following wound care instructions:   Keep the wound clean and dry for the next 24 hours. You can wash the wound gently with soap and water.   DO NOT allow your wound to soak in water (don't do the dishes or go swimming, for example). You can shower, but do not rub your stitches too hard. Let  the wound dry before putting another bandage on.   Take off old dressings every day. Then put on a clean, dry dressing.   If the bandage sticks to the wound, slightly moisten it with water. This way, it can come off more easily.   You can wash the wound gently with soap and water. To help remove a scab, cleanse the area with a mixture of half hydrogen peroxide and half water. This will also help [place] to take out the sutures later.   Allow the area to dry completely before putting on a new bandage.   Unless you receive instructions not to do so, you can place a thin layer of antibiotic ointment over the wound. You can buy Polysporin, Bacitracin, or Neosporin at the store. Neosporin can sometimes cause irritation to your skin. If this happens, stop using it and switch to another topical (surface) antibiotic.   If needed, put a clean, dry bandage over the wound to protect it.    Keep the affected area elevated (lifted) for the next 24 hours. This will decrease swelling and pain. You may also want to put ice on the area. By applying ice to the affected area, swelling and pain can be reduced. Place some ice cubes in a re-sealable (Ziploc) bag and add some water. Put a thin washcloth between the bag and the skin. Apply the ice bag to the area for at  least 20 minutes. Do this at least 4 times per day. It is OK to use the ice more frequently and for longer periods of time. DO NOT APPLY ICE DIRECTLY TO THE SKIN!    If you had a local anesthetic, it will wear off in about 2 hours. Until then, be careful not to hurt yourself because of having less feeling in the area.    YOU SHOULD SEEK MEDICAL ATTENTION IMMEDIATELY, EITHER HERE OR AT THE NEAREST EMERGENCY DEPARTMENT, IF ANY OF THE FOLLOWING OCCURS:   You see redness or swelling.   There are red streaks going up from the injured area.   The wound smells bad or has a lot of drainage.   You have fever (temperature higher than 100.50F / 38C), chills, worse pain and / or swelling.

## 2014-08-22 NOTE — ED Provider Notes (Signed)
Physician/Midlevel provider first contact with patient: 08/22/14 2004           [facility] History and Physical Exam     Patient Name: Shirley Jones, Shirley Jones  Encounter Date:  08/22/2014  Attending Physician: Elray Mcgregor, MD  Patient DOB:  [birth year]  MRN:  [id number]  Room:  23/B23    History of Presenting Illness     79 y.o. female had mechanical fall <1 hr ago on a door threshhold, struck her jaw but not her head, struck her L elbow and has a cut there, R forearm and has a hematoma there, L knee and has swelling there but can range it and walk.     Takes xarelto for h/o PE's, and has h/o expanding L forearm hematoma that needed drainage.     No ha, no confusion or nausea, no focal neurol sx. Fall was purely mechanical per pt.         PMD:  Pcp, Noneorunknown, MD    Nursing Notes Review  Nursing Notes were reviewed.     Previous Records Review  Previous records were reviewed to the extent practicable for the current presentation.    Nursing (triage) note reviewed for the following pertinent information:    Tripped and fell.  No ALOC.  Sustained chin lac, multiple abrasions to arms.  Also c/o L knee pain.      Physical Exam     Vital Signs  BP 177/88 mmHg  Temp(Src) 97.4 F (36.3 C)  Resp 18  Ht 1.676 m  Wt 127.007 kg  BMI 45.21 kg/m2  SpO2 93%    Review of Vital Signs  The patient's vital signs and oxygen saturation were reviewed and interpreted by me, Elray Mcgregor, MD.    Physical Exam    Constitutional: No acute distress. Not ashen. Pleasant.   Eyes: No conjunctival discharge. EOMI.   Head, Ears, Nose, Mouth, Throat: Normocephalic. Moist mucous membranes. Chin has 1cm gaping lac. No ttp.   Neck: Full range of motion. No obvious neck deformities. No tracheal deviation. No c/sp ttp.    Cardiovascular: No obvious friction rubs or gallops. Normal capillary refill.   Respiratory/Chest: No obvious chest wall asymmetry. No resp distress.   Gastrointestinal/Abdominal:  Soft abdomen. No  abdominal distention.   Musculoskeletal: Normal ROM. R forearm min ttp, + small palpable lump c/w hematoma, about 5x3cm. L olecranon 1cm gaping lac, mild ttp but good rom. Good L knee rom but + significant swelling and bruising, and mild diffuse ttp. No ligamentous laxity. NVI all 4. No other extremity inj's.   Back: No deformity. No significant scoliosis. No t or l sp ttp.   Neurological: Alert. No acute focal deficits.   Skin: Warm skin. No acute rash.           MDM and Clinical Notes     Hx & Exam Synthesis, Differential Diagnosis, Plan     Trip and fall:   Hematomas of r forearm and l knee: hope to avoid need for drainage with ace wraps. Probably not best to reverse the xarelto considering the risk/benefit. Pt aware she could develop need for another h'toma drainage.     Lacs of chin and l elbow: will suture.     Contusions to l elbow, r forearm and l knee: will xray.     No sign of head inj and pt denies hitting her head.     Apap, tet.     If ED workup is  unremarkable will likely discharge with close follow-up.           ED Course, Monitors, EKG, Critical Care, Splints, Consults, Reevaluation, etc     Chin Laceration Repair Note    Performed by: Elray Mcgregor  Verbal consent obtained.   Risks, benefits and alternatives were discussed  The site was marked  Patient identity confirmed verbally with patient and arm band  Immediately prior to procedure a "time out" was called to verify the correct patient, procedure, equipment, support staff and site/side marked as required.    Wound explored carefully.   No contamination.  No foreign bodies  No tendon involvement.  No nerve involvement.  No vascular damage.   Preparation: Patient was prepped and draped in the usual sterile fashion.  Irrigation solution: saline  Irrigation method: jet lavage  Amount of cleaning: extensive                               Local anesthetic: lidocaine 1% without epinephrine               Laceration length cm's: 1            Anesthetic total cc's: 1      Skin closure: Nylon 4-0  Number of sutures: 2    Technique: interrupted  Excellent approximation.   Patient tolerated the procedure well with no immediate complications.      Elbow Laceration Repair Note    Performed by: Elray Mcgregor  Verbal consent obtained.   Risks, benefits and alternatives were discussed  The site was marked  Patient identity confirmed verbally with patient and arm band  Immediately prior to procedure a "time out" was called to verify the correct patient, procedure, equipment, support staff and site/side marked as required.    Wound explored carefully.   No contamination.  No foreign bodies  No tendon involvement.  No nerve involvement.  No vascular damage.   Preparation: Patient was prepped and draped in the usual sterile fashion.  Irrigation solution: saline  Irrigation method: jet lavage  Amount of cleaning: extensive                               Local anesthetic: lidocaine 1% without epinephrine               Laceration length cm's: 1           Anesthetic total cc's: 1      Skin closure: Nylon 4-0  Number of sutures: 4    Technique: running  Excellent approximation.   Patient tolerated the procedure well with no immediate complications.            Return Precautions    I counseled extensively on nature of problem--voiced understanding, agrees to follow up.     Given strict return precautions--fully understands:   Pt is to return immediately to emergency department if any worsening symptoms at all--otherwise, return to the emergency department within 3 days for a recheck or follow up with primary physician within 3 days.            Past Medical History     Past Medical History   Diagnosis Date   . Hypertension    . DVT (deep venous thrombosis), bilateral    . PE (pulmonary embolism)        No current facility-administered medications  for this encounter.  Current outpatient prescriptions: rivaroxaban (XARELTO) 20 MG Tab, Take 20 mg by mouth daily with  dinner., Disp: , Rfl: ;  acetaminophen (TYLENOL) 500 MG tablet, Take 1-2 tablets (500-1,000 mg total) by mouth every 6 (six) hours as needed for Pain., Disp: 30 tablet, Rfl: 0    No Known Allergies    Medical history, medications, and allergies reviewed.     Past Surgical History     Past Surgical History   Procedure Laterality Date   . Hernia repair     . Partial hysterectomy         Family History   The family history is not significantly contributory to current presentation.   No family history on file.    Social History   Social history is not significantly contributory to the patient's current presentation.   History     Social History   . Marital Status: Widowed     Spouse Name: N/A     Number of Children: N/A   . Years of Education: N/A     Social History Main Topics   . Smoking status: Never Smoker    . Smokeless tobacco: Not on file   . Alcohol Use: No   . Drug Use: Not on file   . Sexual Activity: Not on file     Other Topics Concern   . Not on file     Social History Narrative   . No narrative on file       Review of Systems     See HPI for review of systems that is relevant to the current presentation.   All other systems reviewed: negative.     ED Medications Administered     ED Medication Orders     Start     Status Ordering Provider    08/22/14 2014  acetaminophen (TYLENOL) tablet 1,000 mg   Once     Route: Oral  Ordered Dose: 1,000 mg     Last MAR action:  Given Bhavya Eschete N    08/22/14 2014  tetanus-diphth-acell pertussis (BOOSTRIX) injection 0.5 mL   Once     Route: Intramuscular  Ordered Dose: 0.5 mL     Last MAR action:  Given Aveleen Nevers N          Orders Placed During This Encounter     Orders Placed This Encounter   Procedures   . Knee 4+ Views Left   . Forearm Complete Right   . Elbow Left AP Lateral and Obliques   . Apply ace wrap       Diagnostic Study Results     The results of the diagnostic studies below were reviewed by the ED provider:    Labs  Results     ** No results  found for the last 24 hours. **          Radiologic Studies  Radiology Results (24 Hour)     Procedure Component Value Units Date/Time    Elbow Left AP Lateral and Obliques [[id number]] Collected:  08/22/14 2057    Order Status:  Completed Updated:  08/22/14 2101    Narrative:      HISTORY: Left elbow pain after injury.    AP, lateral, and both oblique views of the left elbow demonstrate no  fracture, dislocation, effusion, or significant bone lesion. The joints  spaces are normal. There is no olecranon spur.      Impression:  Normal study.    Nelta Numbers, MD   08/22/2014 8:57 PM      Forearm Complete Right [[id number]] Collected:  08/22/14 2055    Order Status:  Completed Updated:  08/22/14 2100    Narrative:      History: Right forearm pain after injury.    AP and lateral right forearm: The radius and ulna appear intact and  aligned. Vascular calcification is noted. There is no elbow effusion.      Impression:        1. No fracture identified.  2. Note that forearm views do not thoroughly evaluate the wrist, and if  clinically warranted, dedicated wrist views are available on request.    Nelta Numbers, MD   08/22/2014 8:56 PM      Knee 4+ Views Left [[id number]] Collected:  08/22/14 2052    Order Status:  Completed Updated:  08/22/14 2058    Narrative:      History: Left knee pain after injury.    AP, lateral, and both oblique views of the left knee: Joint degeneration  is prominent medially, moderate at the patellofemoral joint, milder  laterally. The bones appear intact and aligned with no definite  effusion.      Impression:       Arthritis, greatest at the medial compartment. No obvious  fracture.    Nelta Numbers, MD   08/22/2014 8:54 PM            Scribe and MD Attestations     Rendering Provider: Elray Mcgregor, MD          Diagnosis and Disposition     Clinical Impression  1. Traumatic hematoma of forearm, right, initial encounter    2. Traumatic hematoma of left knee, initial encounter    3.  Contusion of left knee, initial encounter    4. Chin laceration, initial encounter    5. Left elbow contusion    6. Elbow laceration, left, initial encounter        Disposition  ED Disposition     Discharge Tamber L Viner discharge to home/self care.    Condition at disposition: Stable                Prescriptions       Discharge Medication List as of 08/22/2014 10:01 PM      START taking these medications    Details   acetaminophen (TYLENOL) 500 MG tablet Take 1-2 tablets (500-1,000 mg total) by mouth every 6 (six) hours as needed for Pain., Starting 08/22/2014, Until Mon 09/01/14, Print                 Elray Mcgregor, MD  08/23/14 [phone]

## 2015-02-02 ENCOUNTER — Emergency Department (HOSPITAL_COMMUNITY)
Admission: EM | Admit: 2015-02-02 | Discharge: 2015-02-02 | Disposition: A | Discharge status: Home / Self Care | Payer: Medicare Other | Attending: Emergency Medicine | Admitting: Emergency Medicine

## 2015-02-02 ENCOUNTER — Emergency Department (HOSPITAL_COMMUNITY): Payer: Medicare Other

## 2015-02-02 ENCOUNTER — Encounter (HOSPITAL_COMMUNITY): Payer: Self-pay | Admitting: Emergency Medicine

## 2015-02-02 DIAGNOSIS — Z7901 Long term (current) use of anticoagulants: Secondary | ICD-10-CM | POA: Insufficient documentation

## 2015-02-02 DIAGNOSIS — N136 Pyonephrosis: Secondary | ICD-10-CM | POA: Diagnosis not present

## 2015-02-02 DIAGNOSIS — N132 Hydronephrosis with renal and ureteral calculous obstruction: Secondary | ICD-10-CM

## 2015-02-02 DIAGNOSIS — Z79899 Other long term (current) drug therapy: Secondary | ICD-10-CM | POA: Insufficient documentation

## 2015-02-02 DIAGNOSIS — R0600 Dyspnea, unspecified: Secondary | ICD-10-CM | POA: Diagnosis not present

## 2015-02-02 DIAGNOSIS — R1031 Right lower quadrant pain: Secondary | ICD-10-CM | POA: Diagnosis present

## 2015-02-02 DIAGNOSIS — R10819 Abdominal tenderness, unspecified site: Secondary | ICD-10-CM | POA: Diagnosis not present

## 2015-02-02 DIAGNOSIS — N211 Calculus in urethra: Secondary | ICD-10-CM | POA: Diagnosis not present

## 2015-02-02 DIAGNOSIS — F329 Major depressive disorder, single episode, unspecified: Secondary | ICD-10-CM | POA: Insufficient documentation

## 2015-02-02 DIAGNOSIS — D689 Coagulation defect, unspecified: Secondary | ICD-10-CM | POA: Diagnosis not present

## 2015-02-02 DIAGNOSIS — K2970 Gastritis, unspecified, without bleeding: Secondary | ICD-10-CM | POA: Diagnosis not present

## 2015-02-02 DIAGNOSIS — Z872 Personal history of diseases of the skin and subcutaneous tissue: Secondary | ICD-10-CM | POA: Diagnosis not present

## 2015-02-02 DIAGNOSIS — I10 Essential (primary) hypertension: Secondary | ICD-10-CM | POA: Diagnosis not present

## 2015-02-02 DIAGNOSIS — B9689 Other specified bacterial agents as the cause of diseases classified elsewhere: Secondary | ICD-10-CM | POA: Diagnosis not present

## 2015-02-02 DIAGNOSIS — Z87891 Personal history of nicotine dependence: Secondary | ICD-10-CM | POA: Insufficient documentation

## 2015-02-02 DIAGNOSIS — Z8659 Personal history of other mental and behavioral disorders: Secondary | ICD-10-CM | POA: Insufficient documentation

## 2015-02-02 DIAGNOSIS — R000 Tachycardia, unspecified: Secondary | ICD-10-CM | POA: Diagnosis not present

## 2015-02-02 DIAGNOSIS — M1990 Unspecified osteoarthritis, unspecified site: Secondary | ICD-10-CM | POA: Diagnosis not present

## 2015-02-02 DIAGNOSIS — E278 Other specified disorders of adrenal gland: Secondary | ICD-10-CM | POA: Diagnosis not present

## 2015-02-02 DIAGNOSIS — R0902 Hypoxemia: Secondary | ICD-10-CM | POA: Diagnosis not present

## 2015-02-02 DIAGNOSIS — R110 Nausea: Secondary | ICD-10-CM | POA: Diagnosis not present

## 2015-02-02 HISTORY — DX: Systemic involvement of connective tissue, unspecified: M35.9

## 2015-02-02 LAB — URINE MICROSCOPIC-ADD ON

## 2015-02-02 LAB — URINALYSIS, ROUTINE W REFLEX MICROSCOPIC
Bilirubin Urine: NEGATIVE
Glucose, UA: NEGATIVE mg/dL
KETONES UR: 15 mg/dL — AB
Leukocytes, UA: NEGATIVE
Nitrite: NEGATIVE
Protein, ur: 100 mg/dL — AB
Specific Gravity, Urine: >1.046 — ABNORMAL HIGH (ref 1.005–1.030)
Urobilinogen, UA: 0.2 mg/dL (ref 0.0–1.0)
pH: 5 (ref 5.0–8.0)

## 2015-02-02 LAB — BASIC METABOLIC PANEL
ANION GAP: 11 (ref 5–15)
BUN: 17 mg/dL (ref 6–20)
CHLORIDE: 103 mmol/L (ref 101–111)
CO2: 24 mmol/L (ref 22–32)
Calcium: 9.2 mg/dL (ref 8.9–10.3)
Creatinine, Ser: 0.77 mg/dL (ref 0.44–1.00)
GFR calc Af Amer: >60 mL/min (ref >60)
GFR calc non Af Amer: >60 mL/min (ref >60)
GLUCOSE: 141 mg/dL — AB (ref 65–99)
Potassium: 4.1 mmol/L (ref 3.5–5.1)
Sodium: 138 mmol/L (ref 135–145)

## 2015-02-02 LAB — HEPATIC FUNCTION PANEL
ALBUMIN: 4.2 g/dL (ref 3.5–5.0)
ALT: 14 U/L (ref 14–54)
AST: 30 U/L (ref 15–41)
Alkaline Phosphatase: 59 U/L (ref 38–126)
BILIRUBIN TOTAL: 1.4 mg/dL — AB (ref 0.3–1.2)
Bilirubin, Direct: 0.5 mg/dL (ref 0.1–0.5)
Indirect Bilirubin: 0.9 mg/dL (ref 0.3–0.9)
Total Protein: 7.5 g/dL (ref 6.5–8.1)

## 2015-02-02 LAB — BRAIN NATRIURETIC PEPTIDE: B Natriuretic Peptide: 84.6 pg/mL (ref 0.0–100.0)

## 2015-02-02 LAB — CBC
HCT: 51.9 % — ABNORMAL HIGH (ref 36.0–46.0)
HEMOGLOBIN: 17.4 g/dL — AB (ref 12.0–15.0)
MCH: 31.5 pg (ref 26.0–34.0)
MCHC: 33.5 g/dL (ref 30.0–36.0)
MCV: 93.9 fL (ref 78.0–100.0)
Platelets: 180 10*3/uL (ref 150–400)
RBC: 5.53 MIL/uL — ABNORMAL HIGH (ref 3.87–5.11)
RDW: 13.8 % (ref 11.5–15.5)
WBC: 6.8 10*3/uL (ref 4.0–10.5)

## 2015-02-02 LAB — I-STAT TROPONIN, ED: Troponin i, poc: 0.02 ng/mL (ref 0.00–0.08)

## 2015-02-02 LAB — LIPASE, BLOOD: Lipase: 16 U/L — ABNORMAL LOW (ref 22–51)

## 2015-02-02 MED ORDER — IBUPROFEN 800 MG PO TABS: 800.0000 mg | ORAL_TABLET | Freq: Three times a day (TID) | ORAL | Status: AC

## 2015-02-02 MED ORDER — SODIUM CHLORIDE 0.9 % IV BOLUS (SEPSIS)
500.0000 mL | Freq: Once | INTRAVENOUS | Status: AC
  Administered 2015-02-02: 500 mL via INTRAVENOUS

## 2015-02-02 MED ORDER — KETOROLAC TROMETHAMINE 15 MG/ML IJ SOLN
30.0000 mg | Freq: Once | INTRAMUSCULAR | Status: AC
  Administered 2015-02-02: 30 mg via INTRAVENOUS
  Filled 2015-02-02: qty 2

## 2015-02-02 MED ORDER — TAMSULOSIN HCL 0.4 MG PO CAPS: 0.4000 mg | ORAL_CAPSULE | Freq: Every day | ORAL | Status: AC

## 2015-02-02 MED ORDER — IOHEXOL 300 MG/ML  SOLN
100.0000 mL | Freq: Once | INTRAMUSCULAR | Status: AC | PRN
  Administered 2015-02-02: 100 mL via INTRAVENOUS

## 2015-02-02 MED ORDER — HYDROMORPHONE HCL 1 MG/ML IJ SOLN
1.0000 mg | Freq: Once | INTRAMUSCULAR | Status: AC
  Administered 2015-02-02: 1 mg via INTRAVENOUS
  Filled 2015-02-02: qty 1

## 2015-02-02 MED ORDER — ONDANSETRON HCL 4 MG/2ML IJ SOLN
4.0000 mg | Freq: Once | INTRAMUSCULAR | Status: AC
  Administered 2015-02-02: 4 mg via INTRAVENOUS
  Filled 2015-02-02: qty 2

## 2015-02-02 MED ORDER — KETOROLAC TROMETHAMINE 15 MG/ML IJ SOLN: 15.0000 mg | Freq: Once | INTRAMUSCULAR | Status: DC

## 2015-02-02 MED ORDER — IOHEXOL 300 MG/ML  SOLN
25.0000 mL | Freq: Once | INTRAMUSCULAR | Status: AC | PRN
  Administered 2015-02-02: 25 mL via ORAL

## 2015-02-02 MED ORDER — HYDROCODONE-ACETAMINOPHEN 5-325 MG PO TABS: 1.0000 | ORAL_TABLET | Freq: Four times a day (QID) | ORAL | Status: AC | PRN

## 2015-02-02 NOTE — Discharge Instructions (Signed)

## 2015-02-02 NOTE — ED Provider Notes (Signed)
CSN: [id number]     Arrival date & time 02/02/15  1658 History   First MD Initiated Contact with Patient 02/02/15 1710     Chief Complaint  Patient presents with  . Abdominal Pain  . Atrial Fibrillation    new onset? on xarelto for DVT, hypoxic      (Consider location/radiation/quality/duration/timing/severity/associated sxs/prior Treatment) Patient is a 80 y.o. female presenting with abdominal pain.  Abdominal Pain Pain location:  RLQ Pain quality: aching and stabbing   Pain radiates to:  Does not radiate Pain severity:  Severe Onset quality:  Sudden Duration:  5 hours Timing:  Constant Progression:  Waxing and waning Chronicity:  New Context: not alcohol use, not awakening from sleep, not diet changes, not eating, not laxative use, not medication withdrawal, not previous surgeries, not recent illness, not recent sexual activity, not recent travel, not retching, not sick contacts, not suspicious food intake and not trauma   Relieved by:  None tried Worsened by:  Nothing tried Ineffective treatments:  None tried Associated symptoms: hematuria and nausea   Associated symptoms: no anorexia, no belching, no chest pain, no chills, no constipation, no cough, no diarrhea, no dysuria, no fatigue, no fever, no melena, no shortness of breath, no sore throat and no vomiting   Risk factors: obesity     Past Medical History  Diagnosis Date  . Hypertension   . Cellulitis   . Cataract   . Arthritis   . Clotting disorder     Previous blood clot in leg  . Depression   . Collagen vascular disease    Past Surgical History  Procedure Laterality Date  . Pelvic laparoscopy  2012    BORDERLINE OVARIAN TUMOR SEROUS RIGHT  . Cataract extraction    . Foot surgery    . Varicose veins    . Oophorectomy  2012    BSO  ENDOSALPINGIOSIS  . Abdominal surgery  [age]    UMBILICAL HERNIA   Family History  Problem Relation Age of Onset  . Heart disease Mother   . Diabetes Brother   .  Hypertension Brother   . Breast cancer Daughter   . Hypertension Daughter   . Cancer Daughter     breast and liver   History  Substance Use Topics  . Smoking status: Former Smoker -- 0.50 packs/day    Types: Cigarettes  . Smokeless tobacco: Not on file  . Alcohol Use: No   OB History    Gravida Para Term Preterm AB TAB SAB Ectopic Multiple Living   4 4        4      Review of Systems  Constitutional: Negative for fever, chills, appetite change and fatigue.  HENT: Negative for congestion, ear pain, facial swelling, mouth sores and sore throat.   Eyes: Negative for visual disturbance.  Respiratory: Negative for cough, chest tightness and shortness of breath.   Cardiovascular: Negative for chest pain and palpitations.  Gastrointestinal: Positive for nausea and abdominal pain. Negative for vomiting, diarrhea, constipation, blood in stool, melena and anorexia.  Endocrine: Negative for cold intolerance and heat intolerance.  Genitourinary: Positive for hematuria. Negative for dysuria, frequency, decreased urine volume and difficulty urinating.  Musculoskeletal: Negative for back pain and neck stiffness.  Skin: Negative for rash.  Neurological: Negative for dizziness, weakness, light-headedness and headaches.  All other systems reviewed and are negative.     Allergies  Morphine and related  Home Medications   Prior to Admission medications   Medication Sig  Start Date End Date Taking? Authorizing Provider  amLODipine (NORVASC) 5 MG tablet Take 5 mg by mouth every morning.    Yes Historical Provider, MD  buPROPion (ZYBAN) 150 MG 12 hr tablet Take 150 mg by mouth 2 (two) times daily.   Yes Historical Provider, MD  fesoterodine (TOVIAZ) 8 MG TB24 Take 8 mg by mouth daily.   Yes Historical Provider, MD  olmesartan-hydrochlorothiazide (BENICAR HCT) 40-25 MG per tablet Take 1 tablet by mouth every morning.    Yes Historical Provider, MD  rivaroxaban (XARELTO) 20 MG TABS tablet Take 20  mg by mouth daily with supper.   Yes Historical Provider, MD  venlafaxine (EFFEXOR XR) 75 MG 24 hr capsule Take 225 mg by mouth every morning.    Yes Historical Provider, MD  albuterol (PROVENTIL HFA;VENTOLIN HFA) 108 (90 BASE) MCG/ACT inhaler Inhale 2 puffs into the lungs every 6 (six) hours as needed for wheezing or shortness of breath. Patient not taking: Reported on 02/02/2015 06/09/12   Lavone Orn, MD  HYDROcodone-acetaminophen [facility]) 5-325 MG per tablet Take 1 tablet by mouth every 6 (six) hours as needed for severe pain. 02/02/15 02/11/15  Addison Lank, MD  ibuprofen (ADVIL,MOTRIN) 800 MG tablet Take 1 tablet (800 mg total) by mouth 3 (three) times daily. 02/02/15 02/11/15  Addison Lank, MD  tamsulosin (FLOMAX) 0.4 MG CAPS capsule Take 1 capsule (0.4 mg total) by mouth daily. 02/02/15 02/11/15  Addison Lank, MD  warfarin (COUMADIN) 5 MG tablet Take 5 mg by mouth every evening. 06/09/12   Lavone Orn, MD   BP 138/65 mmHg  Pulse 93  Temp(Src) 98.4 F (36.9 C) (Oral)  Resp 17  Ht 5\' 6"  (1.676 m)  Wt 290 lb (131.543 kg)  BMI 46.83 kg/m2  SpO2 90% Physical Exam  Constitutional: She is oriented to person, place, and time. She appears well-developed and well-nourished. No distress.  HENT:  Head: Normocephalic and atraumatic.  Right Ear: External ear normal.  Left Ear: External ear normal.  Nose: Nose normal.  Eyes: Conjunctivae and EOM are normal. Pupils are equal, round, and reactive to light. Right eye exhibits no discharge. Left eye exhibits no discharge. No scleral icterus.  Neck: Normal range of motion. Neck supple.  Cardiovascular: Regular rhythm and normal heart sounds.  Tachycardia present.  Exam reveals no gallop and no friction rub.   No murmur heard. Pulmonary/Chest: Effort normal and breath sounds normal. No stridor. No respiratory distress. She has no wheezes.  Abdominal: Soft. She exhibits no distension. There is tenderness in the right lower quadrant. There is CVA tenderness  (right flank). There is no tenderness at McBurney's point and negative Murphy's sign.  Musculoskeletal: She exhibits no edema or tenderness.  Neurological: She is alert and oriented to person, place, and time.  Skin: Skin is warm and dry. No rash noted. She is not diaphoretic. No erythema.  Psychiatric: She has a normal mood and affect.    ED Course  Procedures (including critical care time) Labs Review Labs Reviewed  CBC - Abnormal; Notable for the following:    RBC 5.53 (*)    Hemoglobin 17.4 (*)    HCT 51.9 (*)    All other components within normal limits  BASIC METABOLIC PANEL - Abnormal; Notable for the following:    Glucose, Bld 141 (*)    All other components within normal limits  LIPASE, BLOOD - Abnormal; Notable for the following:    Lipase 16 (*)    All other components within normal  limits  URINALYSIS, ROUTINE W REFLEX MICROSCOPIC (NOT AT [facility]) - Abnormal; Notable for the following:    APPearance HAZY (*)    Specific Gravity, Urine >1.046 (*)    Hgb urine dipstick LARGE (*)    Ketones, ur 15 (*)    Protein, ur 100 (*)    All other components within normal limits  HEPATIC FUNCTION PANEL - Abnormal; Notable for the following:    Total Bilirubin 1.4 (*)    All other components within normal limits  URINE MICROSCOPIC-ADD ON - Abnormal; Notable for the following:    Squamous Epithelial / LPF FEW (*)    Bacteria, UA FEW (*)    All other components within normal limits  URINE CULTURE  BRAIN NATRIURETIC PEPTIDE  I-STAT TROPOININ, ED    Imaging Review Ct Abdomen Pelvis W Contrast  02/02/2015   CLINICAL DATA:  Right-sided abdominal pain.  Nausea for 1 day.  EXAM: CT ABDOMEN AND PELVIS WITH CONTRAST  TECHNIQUE: Multidetector CT imaging of the abdomen and pelvis was performed using the standard protocol following bolus administration of intravenous contrast.  CONTRAST:  133mL OMNIPAQUE IOHEXOL 300 MG/ML  SOLN  COMPARISON:  11/13/2012, 07/12/2010  FINDINGS: Lower chest: There  is left basilar atelectasis. There is no pleural effusion. The heart size is normal.  Hepatobiliary: The liver is normal. There is no hepatic mass. There is no intrahepatic or extrahepatic biliary ductal dilatation. The gallbladder is normal.  Pancreas: Normal.  Spleen: Normal.  Adrenals/Urinary Tract: There is a 2.2 cm left adrenal mass unchanged compared with 07/12/2010. There are 2 right adrenal masses measuring 2.3 cm and 2.2 cm respectively similar in size and appearance compared with 07/12/2010. There are nonobstructing right nephrolithiasis. There is a 10 mm calculus in the right renal pelvis. There is a 6 mm distal right ureteral calculus resulting in moderate right hydroureteronephrosis. There is mild right perinephric stranding. There is no left nephrolithiasis. The bladder is normal.  Stomach/Bowel: There is no bowel dilatation or wall thickening. There is no pneumoperitoneum, pneumatosis or portal venous gas. There is a wide mouth fat containing ventral abdominal hernia.  Vascular/Lymphatic: The abdominal aorta is normal in caliber with atherosclerosis. There is no abdominal or pelvic lymphadenopathy.  Reproductive: The uterus is grossly normal. There is evidence of prior bilateral oophorectomy.  Other: There is no focal fluid collection or hematoma.  Musculoskeletal: No lytic or sclerotic osseous lesion. Degenerative disc disease with disc height loss and bilateral facet arthropathy at L5-S1. Bilateral facet arthropathy at L4-5.  IMPRESSION: 1. There is a 6 mm distal right ureteral calculus resulting in moderate right hydroureteronephrosis. There are right nephrolithiasis. 2. Bilateral adrenal masses unchanged compared with 07/12/2010 likely reflecting adrenal adenomas. 3. Wide mouth, fact containing ventral abdominal hernia.   Electronically Signed   By: Kathreen Devoid   On: 02/02/2015 19:40   Dg Chest Port 1 View  02/02/2015   CLINICAL DATA:  Respiratory distress for 1 day with hypoxia  EXAM: PORTABLE  CHEST - 1 VIEW  COMPARISON:  11/13/2012  FINDINGS: Cardiac shadow is stable. The lungs demonstrate some very mild interstitial changes which are also stable. No focal infiltrate or sizable effusion is seen. No acute bony abnormality is noted.  IMPRESSION: No acute abnormality seen.   Electronically Signed   By: Inez Catalina M.D.   On: 02/02/2015 17:20     EKG Interpretation None      MDM   80 year old female with a history of hypertension and DVTs and acute  pulmonary embolus some on Zar[place]in presents with right lower quadrant abdominal pain. History and exam as above. Imaging revealed a 6 mm right urethral stone with moderate hydronephrosis. No evidence of colitis, appendicitis, or diverticulitis UA with no evidence of infection. No evidence of acute kidney injury. Patient provided with symptomatic pain and nausea medication and had moderate improvement. Urology called and will set up patient for follow-up appointment.  Patient safe for discharge and given strict return precautions. Patient to follow-up with urology.  Patient discharged in good condition. Next  Patient seen with Dr. Tamera Punt.  Sibyl Parr Resident  Final diagnoses:  Urethral stone  Hydronephrosis with urinary obstruction due to ureteral calculus     Addison Lank, MD 02/02/15 [place], MD 02/02/15 [place], MD 02/02/15 2326

## 2015-02-02 NOTE — ED Notes (Signed)
Verbal consult to urology per Dr. Leonette Monarch

## 2015-02-02 NOTE — ED Notes (Signed)
Per EMS:  Initially called out for rlq abd pain, was found to be 91% on room air, no hx of breathing problems.  Also in afib with rate 80-110, no hx.  On xeralto for DVT.  Abd tender to palpation RLQ, no rebound tenderness, still has appendix.  BP 158/98, rate 80-100.  Currently 88-91% on room air and currently in afib.

## 2015-02-02 NOTE — ED Notes (Signed)
Pt also received 150 mcg of fentanyl and 4 mg of zofran en route by EMS.

## 2015-02-02 NOTE — ED Notes (Addendum)
Perineum cleaned with soap and water.  In and out cath done without difficulty, clear yellow urine return

## 2015-02-02 NOTE — ED Notes (Signed)
Discharge instructions and prescriptions reviewed, voiced understanding. 

## 2015-02-04 LAB — URINE CULTURE
Colony Count: NO GROWTH
Culture: NO GROWTH

## 2015-02-06 DIAGNOSIS — N3941 Urge incontinence: Secondary | ICD-10-CM | POA: Diagnosis not present

## 2015-02-06 DIAGNOSIS — N200 Calculus of kidney: Secondary | ICD-10-CM | POA: Diagnosis not present

## 2015-02-06 DIAGNOSIS — N201 Calculus of ureter: Secondary | ICD-10-CM | POA: Diagnosis not present

## 2015-03-13 DIAGNOSIS — N3941 Urge incontinence: Secondary | ICD-10-CM | POA: Diagnosis not present

## 2015-03-13 DIAGNOSIS — R350 Frequency of micturition: Secondary | ICD-10-CM | POA: Diagnosis not present

## 2015-04-24 DIAGNOSIS — R350 Frequency of micturition: Secondary | ICD-10-CM | POA: Diagnosis not present

## 2015-04-24 DIAGNOSIS — N3941 Urge incontinence: Secondary | ICD-10-CM | POA: Diagnosis not present

## 2015-07-09 DIAGNOSIS — Z86711 Personal history of pulmonary embolism: Secondary | ICD-10-CM | POA: Diagnosis not present

## 2015-07-09 DIAGNOSIS — Z23 Encounter for immunization: Secondary | ICD-10-CM | POA: Diagnosis not present

## 2015-07-09 DIAGNOSIS — I10 Essential (primary) hypertension: Secondary | ICD-10-CM | POA: Diagnosis not present

## 2015-07-09 DIAGNOSIS — E7800 Pure hypercholesterolemia, unspecified: Secondary | ICD-10-CM | POA: Diagnosis not present

## 2015-07-09 DIAGNOSIS — Z5181 Encounter for therapeutic drug level monitoring: Secondary | ICD-10-CM | POA: Diagnosis not present

## 2015-07-09 DIAGNOSIS — Z6841 Body Mass Index (BMI) 40.0 and over, adult: Secondary | ICD-10-CM | POA: Diagnosis not present

## 2015-07-09 DIAGNOSIS — F324 Major depressive disorder, single episode, in partial remission: Secondary | ICD-10-CM | POA: Diagnosis not present

## 2015-07-13 DIAGNOSIS — H04123 Dry eye syndrome of bilateral lacrimal glands: Secondary | ICD-10-CM | POA: Diagnosis not present

## 2015-07-13 DIAGNOSIS — H1859 Other hereditary corneal dystrophies: Secondary | ICD-10-CM | POA: Diagnosis not present

## 2015-10-23 DIAGNOSIS — H16223 Keratoconjunctivitis sicca, not specified as Sjogren's, bilateral: Secondary | ICD-10-CM | POA: Diagnosis not present

## 2015-10-23 DIAGNOSIS — H04123 Dry eye syndrome of bilateral lacrimal glands: Secondary | ICD-10-CM | POA: Diagnosis not present

## 2016-07-07 DIAGNOSIS — Z23 Encounter for immunization: Secondary | ICD-10-CM | POA: Diagnosis not present

## 2016-11-16 DIAGNOSIS — Z0000 Encounter for general adult medical examination without abnormal findings: Secondary | ICD-10-CM | POA: Diagnosis not present

## 2016-11-16 DIAGNOSIS — N3281 Overactive bladder: Secondary | ICD-10-CM | POA: Diagnosis not present

## 2016-11-16 DIAGNOSIS — E7800 Pure hypercholesterolemia, unspecified: Secondary | ICD-10-CM | POA: Diagnosis not present

## 2016-11-16 DIAGNOSIS — Z1389 Encounter for screening for other disorder: Secondary | ICD-10-CM | POA: Diagnosis not present

## 2016-11-16 DIAGNOSIS — I10 Essential (primary) hypertension: Secondary | ICD-10-CM | POA: Diagnosis not present

## 2016-11-16 DIAGNOSIS — Z86711 Personal history of pulmonary embolism: Secondary | ICD-10-CM | POA: Diagnosis not present

## 2016-11-16 DIAGNOSIS — Z6841 Body Mass Index (BMI) 40.0 and over, adult: Secondary | ICD-10-CM | POA: Diagnosis not present

## 2016-11-16 DIAGNOSIS — R8290 Unspecified abnormal findings in urine: Secondary | ICD-10-CM | POA: Diagnosis not present

## 2016-11-16 DIAGNOSIS — F324 Major depressive disorder, single episode, in partial remission: Secondary | ICD-10-CM | POA: Diagnosis not present

## 2016-12-27 DIAGNOSIS — N3281 Overactive bladder: Secondary | ICD-10-CM | POA: Diagnosis not present

## 2016-12-27 DIAGNOSIS — R609 Edema, unspecified: Secondary | ICD-10-CM | POA: Diagnosis not present

## 2016-12-27 DIAGNOSIS — I10 Essential (primary) hypertension: Secondary | ICD-10-CM | POA: Diagnosis not present

## 2016-12-27 DIAGNOSIS — F324 Major depressive disorder, single episode, in partial remission: Secondary | ICD-10-CM | POA: Diagnosis not present

## 2017-02-21 DIAGNOSIS — M7522 Bicipital tendinitis, left shoulder: Secondary | ICD-10-CM | POA: Diagnosis not present

## 2017-06-05 ENCOUNTER — Other Ambulatory Visit: Payer: Self-pay | Admitting: Physician Assistant

## 2017-06-05 DIAGNOSIS — W19XXXA Unspecified fall, initial encounter: Secondary | ICD-10-CM

## 2017-06-08 ENCOUNTER — Ambulatory Visit
Admission: RE | Admit: 2017-06-08 | Discharge: 2017-06-08 | Disposition: A | Discharge status: Home / Self Care | Payer: Medicare Other | Source: Ambulatory Visit | Attending: Physician Assistant | Admitting: Physician Assistant

## 2017-06-08 DIAGNOSIS — W19XXXA Unspecified fall, initial encounter: Secondary | ICD-10-CM

## 2017-06-08 DIAGNOSIS — R600 Localized edema: Secondary | ICD-10-CM | POA: Diagnosis not present

## 2017-06-09 ENCOUNTER — Ambulatory Visit
Admission: RE | Admit: 2017-06-09 | Discharge: 2017-06-09 | Disposition: A | Discharge status: Home / Self Care | Payer: Medicare Other | Source: Ambulatory Visit | Attending: Physician Assistant | Admitting: Physician Assistant

## 2017-06-09 DIAGNOSIS — R600 Localized edema: Secondary | ICD-10-CM | POA: Diagnosis not present

## 2017-06-09 DIAGNOSIS — W19XXXA Unspecified fall, initial encounter: Secondary | ICD-10-CM

## 2017-06-23 DIAGNOSIS — Z23 Encounter for immunization: Secondary | ICD-10-CM | POA: Diagnosis not present

## 2017-06-23 DIAGNOSIS — I10 Essential (primary) hypertension: Secondary | ICD-10-CM | POA: Diagnosis not present

## 2017-06-23 DIAGNOSIS — S40021A Contusion of right upper arm, initial encounter: Secondary | ICD-10-CM | POA: Diagnosis not present

## 2017-06-23 DIAGNOSIS — N3281 Overactive bladder: Secondary | ICD-10-CM | POA: Diagnosis not present

## 2017-06-23 DIAGNOSIS — F324 Major depressive disorder, single episode, in partial remission: Secondary | ICD-10-CM | POA: Diagnosis not present

## 2018-01-23 DIAGNOSIS — E7800 Pure hypercholesterolemia, unspecified: Secondary | ICD-10-CM | POA: Diagnosis not present

## 2018-01-23 DIAGNOSIS — M47812 Spondylosis without myelopathy or radiculopathy, cervical region: Secondary | ICD-10-CM | POA: Diagnosis not present

## 2018-01-23 DIAGNOSIS — F324 Major depressive disorder, single episode, in partial remission: Secondary | ICD-10-CM | POA: Diagnosis not present

## 2018-01-23 DIAGNOSIS — Z79899 Other long term (current) drug therapy: Secondary | ICD-10-CM | POA: Diagnosis not present

## 2018-01-23 DIAGNOSIS — N3281 Overactive bladder: Secondary | ICD-10-CM | POA: Diagnosis not present

## 2018-01-23 DIAGNOSIS — M5030 Other cervical disc degeneration, unspecified cervical region: Secondary | ICD-10-CM | POA: Diagnosis not present

## 2018-01-23 DIAGNOSIS — I10 Essential (primary) hypertension: Secondary | ICD-10-CM | POA: Diagnosis not present

## 2018-01-23 DIAGNOSIS — Z789 Other specified health status: Secondary | ICD-10-CM | POA: Diagnosis not present

## 2018-01-23 DIAGNOSIS — Z86711 Personal history of pulmonary embolism: Secondary | ICD-10-CM | POA: Diagnosis not present

## 2018-01-23 DIAGNOSIS — Z1389 Encounter for screening for other disorder: Secondary | ICD-10-CM | POA: Diagnosis not present

## 2018-01-23 DIAGNOSIS — Z0000 Encounter for general adult medical examination without abnormal findings: Secondary | ICD-10-CM | POA: Diagnosis not present

## 2018-02-13 DIAGNOSIS — H26491 Other secondary cataract, right eye: Secondary | ICD-10-CM | POA: Diagnosis not present

## 2018-02-13 DIAGNOSIS — H16223 Keratoconjunctivitis sicca, not specified as Sjogren's, bilateral: Secondary | ICD-10-CM | POA: Diagnosis not present

## 2018-02-13 DIAGNOSIS — H1859 Other hereditary corneal dystrophies: Secondary | ICD-10-CM | POA: Diagnosis not present

## 2018-03-17 ENCOUNTER — Inpatient Hospital Stay (HOSPITAL_COMMUNITY)
Admission: EM | Admit: 2018-03-17 | Discharge: 2018-03-29 | DRG: 300 | Disposition: E | Payer: Medicare Other | Attending: Cardiothoracic Surgery | Admitting: Cardiothoracic Surgery

## 2018-03-17 ENCOUNTER — Other Ambulatory Visit: Payer: Self-pay

## 2018-03-17 ENCOUNTER — Emergency Department (HOSPITAL_COMMUNITY): Payer: Medicare Other

## 2018-03-17 ENCOUNTER — Encounter (HOSPITAL_COMMUNITY): Payer: Self-pay | Admitting: Emergency Medicine

## 2018-03-17 DIAGNOSIS — I7100 Dissection of unspecified site of aorta: Secondary | ICD-10-CM | POA: Diagnosis present

## 2018-03-17 DIAGNOSIS — N200 Calculus of kidney: Secondary | ICD-10-CM | POA: Diagnosis not present

## 2018-03-17 DIAGNOSIS — Z6841 Body Mass Index (BMI) 40.0 and over, adult: Secondary | ICD-10-CM

## 2018-03-17 DIAGNOSIS — Z66 Do not resuscitate: Secondary | ICD-10-CM | POA: Diagnosis present

## 2018-03-17 DIAGNOSIS — R001 Bradycardia, unspecified: Secondary | ICD-10-CM | POA: Diagnosis not present

## 2018-03-17 DIAGNOSIS — I4891 Unspecified atrial fibrillation: Secondary | ICD-10-CM | POA: Diagnosis present

## 2018-03-17 DIAGNOSIS — Z86718 Personal history of other venous thrombosis and embolism: Secondary | ICD-10-CM

## 2018-03-17 DIAGNOSIS — I7101 Dissection of thoracic aorta: Secondary | ICD-10-CM | POA: Diagnosis not present

## 2018-03-17 DIAGNOSIS — I71019 Dissection of thoracic aorta, unspecified: Secondary | ICD-10-CM

## 2018-03-17 DIAGNOSIS — R0902 Hypoxemia: Secondary | ICD-10-CM | POA: Diagnosis not present

## 2018-03-17 DIAGNOSIS — I10 Essential (primary) hypertension: Secondary | ICD-10-CM | POA: Diagnosis present

## 2018-03-17 DIAGNOSIS — Z86711 Personal history of pulmonary embolism: Secondary | ICD-10-CM

## 2018-03-17 DIAGNOSIS — R079 Chest pain, unspecified: Secondary | ICD-10-CM | POA: Diagnosis not present

## 2018-03-17 DIAGNOSIS — I499 Cardiac arrhythmia, unspecified: Secondary | ICD-10-CM | POA: Diagnosis not present

## 2018-03-17 DIAGNOSIS — R0789 Other chest pain: Secondary | ICD-10-CM | POA: Diagnosis not present

## 2018-03-17 DIAGNOSIS — R0681 Apnea, not elsewhere classified: Secondary | ICD-10-CM | POA: Diagnosis not present

## 2018-03-17 DIAGNOSIS — Z885 Allergy status to narcotic agent status: Secondary | ICD-10-CM

## 2018-03-17 DIAGNOSIS — J9811 Atelectasis: Secondary | ICD-10-CM | POA: Diagnosis not present

## 2018-03-17 DIAGNOSIS — Z7901 Long term (current) use of anticoagulants: Secondary | ICD-10-CM | POA: Diagnosis not present

## 2018-03-17 DIAGNOSIS — Z9849 Cataract extraction status, unspecified eye: Secondary | ICD-10-CM

## 2018-03-17 DIAGNOSIS — Z79899 Other long term (current) drug therapy: Secondary | ICD-10-CM

## 2018-03-17 DIAGNOSIS — I7103 Dissection of thoracoabdominal aorta: Secondary | ICD-10-CM | POA: Diagnosis not present

## 2018-03-17 DIAGNOSIS — I959 Hypotension, unspecified: Secondary | ICD-10-CM | POA: Diagnosis not present

## 2018-03-17 DIAGNOSIS — R5381 Other malaise: Secondary | ICD-10-CM | POA: Diagnosis present

## 2018-03-17 DIAGNOSIS — F329 Major depressive disorder, single episode, unspecified: Secondary | ICD-10-CM | POA: Diagnosis present

## 2018-03-17 DIAGNOSIS — K439 Ventral hernia without obstruction or gangrene: Secondary | ICD-10-CM | POA: Diagnosis present

## 2018-03-17 DIAGNOSIS — Z87891 Personal history of nicotine dependence: Secondary | ICD-10-CM

## 2018-03-17 DIAGNOSIS — I213 ST elevation (STEMI) myocardial infarction of unspecified site: Secondary | ICD-10-CM | POA: Diagnosis not present

## 2018-03-17 LAB — CBC
HCT: 42 % (ref 36.0–46.0)
HEMOGLOBIN: 13 g/dL (ref 12.0–15.0)
MCH: 31.6 pg (ref 26.0–34.0)
MCHC: 31 g/dL (ref 30.0–36.0)
MCV: 101.9 fL — ABNORMAL HIGH (ref 78.0–100.0)
PLATELETS: 143 10*3/uL — AB (ref 150–400)
RBC: 4.12 MIL/uL (ref 3.87–5.11)
RDW: 13.8 % (ref 11.5–15.5)
WBC: 5.7 10*3/uL (ref 4.0–10.5)

## 2018-03-17 LAB — D-DIMER, QUANTITATIVE (NOT AT ARMC): D DIMER QUANT: 10.86 ug{FEU}/mL — AB (ref 0.00–0.50)

## 2018-03-17 LAB — BASIC METABOLIC PANEL
ANION GAP: 9 (ref 5–15)
BUN: 19 mg/dL (ref 8–23)
CALCIUM: 6.9 mg/dL — AB (ref 8.9–10.3)
CO2: 21 mmol/L — ABNORMAL LOW (ref 22–32)
CREATININE: 0.67 mg/dL (ref 0.44–1.00)
Chloride: 114 mmol/L — ABNORMAL HIGH (ref 98–111)
GFR calc non Af Amer: >60 mL/min (ref >60)
Glucose, Bld: 104 mg/dL — ABNORMAL HIGH (ref 70–99)
Potassium: 5.5 mmol/L — ABNORMAL HIGH (ref 3.5–5.1)
SODIUM: 144 mmol/L (ref 135–145)

## 2018-03-17 LAB — HEPATIC FUNCTION PANEL
ALBUMIN: 2.6 g/dL — AB (ref 3.5–5.0)
ALK PHOS: 44 U/L (ref 38–126)
ALT: 18 U/L (ref 0–44)
AST: 31 U/L (ref 15–41)
BILIRUBIN INDIRECT: 1.1 mg/dL — AB (ref 0.3–0.9)
Bilirubin, Direct: 0.7 mg/dL — ABNORMAL HIGH (ref 0.0–0.2)
Total Bilirubin: 1.8 mg/dL — ABNORMAL HIGH (ref 0.3–1.2)
Total Protein: 4.6 g/dL — ABNORMAL LOW (ref 6.5–8.1)

## 2018-03-17 LAB — I-STAT TROPONIN, ED: TROPONIN I, POC: 0.02 ng/mL (ref 0.00–0.08)

## 2018-03-17 LAB — LIPASE, BLOOD: Lipase: 30 U/L (ref 11–51)

## 2018-03-17 MED ORDER — FENTANYL CITRATE (PF) 100 MCG/2ML IJ SOLN
50.0000 ug | Freq: Once | INTRAMUSCULAR | Status: AC
  Administered 2018-03-17: 50 ug via INTRAVENOUS
  Filled 2018-03-17: qty 2

## 2018-03-17 MED ORDER — EMPTY CONTAINERS FLEXIBLE MISC
900.0000 mg | Freq: Once | Status: AC
  Administered 2018-03-18: 900 mg via INTRAVENOUS
  Filled 2018-03-17 (×2): qty 90

## 2018-03-17 MED ORDER — GI COCKTAIL ~~LOC~~
30.0000 mL | Freq: Once | ORAL | Status: AC
Start: 2018-03-17 — End: 2018-03-17
  Administered 2018-03-17: 30 mL via ORAL
  Filled 2018-03-17: qty 30

## 2018-03-17 MED ORDER — IOPAMIDOL (ISOVUE-370) INJECTION 76%
100.0000 mL | Freq: Once | INTRAVENOUS | Status: AC | PRN
  Administered 2018-03-17: 100 mL via INTRAVENOUS

## 2018-03-17 MED ORDER — IOPAMIDOL (ISOVUE-370) INJECTION 76%
INTRAVENOUS | Status: AC
  Filled 2018-03-17: qty 100

## 2018-03-17 MED ORDER — HYDROMORPHONE HCL 1 MG/ML IJ SOLN
0.5000 mg | Freq: Once | INTRAMUSCULAR | Status: AC
  Administered 2018-03-17: 0.5 mg via INTRAVENOUS
  Filled 2018-03-17: qty 1

## 2018-03-17 MED ORDER — ONDANSETRON HCL 4 MG/2ML IJ SOLN
4.0000 mg | Freq: Once | INTRAMUSCULAR | Status: AC
  Administered 2018-03-17: 4 mg via INTRAVENOUS
  Filled 2018-03-17: qty 2

## 2018-03-17 NOTE — ED Provider Notes (Signed)
[facility] EMERGENCY DEPARTMENT Provider Note   CSN: [id number] Arrival date & time: 03/08/2018  1929     History   Chief Complaint Chief Complaint  Patient presents with  . Chest Pain    HPI Brittany [name] is a 83 y.o. female.  HPI Patient presents with chest pain.  Began acutely around 639.  Sharp pain in her mid chest.  She states that it feels like reflux she has had in the past.  States she does needs a Tums.  She is on Xarelto for previous pulmonary embolism.  States pain is sharp.  No nausea or vomiting.  Took her Xarelto last night but not yet today.  Denies shortness of breath but was hypoxic for EMS. Past Medical History:  Diagnosis Date  . Arthritis   . Cataract   . Cellulitis   . Clotting disorder (HCC)    Previous blood clot in leg  . Collagen vascular disease ([place])   . Depression   . Hypertension     Patient Active Problem List   Diagnosis Date Noted  . DVT of lower extremity (deep venous thrombosis) ([place]) 06/03/2012  . Acute pulmonary embolus ([place]) 06/03/2012  . Hypokalemia 06/03/2012  . Ovarian tumor of borderline malignancy 06/03/2012  . Hypertension 09/08/2011    Past Surgical History:  Procedure Laterality Date  . ABDOMINAL SURGERY  4098   UMBILICAL HERNIA  . CATARACT EXTRACTION    . FOOT SURGERY    . OOPHORECTOMY  2012   BSO  ENDOSALPINGIOSIS  . PELVIC LAPAROSCOPY  2012   BORDERLINE OVARIAN TUMOR SEROUS RIGHT  . VARICOSE VEINS       OB History    Gravida  4   Para  4   Term      Preterm      AB      Living  4     SAB      TAB      Ectopic      Multiple      Live Births               Home Medications    Prior to Admission medications   Medication Sig Start Date End Date Taking? Authorizing Provider  acetaminophen (TYLENOL) 500 MG tablet Take 500-1,000 mg by mouth every 6 (six) hours as needed (for pain or headaches).    Yes [provider]  amLODipine (NORVASC) 5 MG tablet Take 5 mg  by mouth every morning.    Yes [provider]  buPROPion (WELLBUTRIN XL) 150 MG 24 hr tablet Take 150 mg by mouth daily.   Yes [provider]  HYDROcodone-acetaminophen (NORCO/VICODIN) 5-325 MG tablet Take 1 tablet by mouth at bedtime as needed (for pain).  01/25/18  Yes [provider]  losartan-hydrochlorothiazide (HYZAAR) 100-25 MG tablet Take 1 tablet by mouth daily.   Yes [provider]  rivaroxaban (XARELTO) 20 MG TABS tablet Take 20 mg by mouth at bedtime.    Yes [provider]  venlafaxine (EFFEXOR XR) 75 MG 24 hr capsule Take 225 mg by mouth every morning.    Yes [provider]  albuterol (PROVENTIL HFA;VENTOLIN HFA) 108 (90 BASE) MCG/ACT inhaler Inhale 2 puffs into the lungs every 6 (six) hours as needed for wheezing or shortness of breath. Patient not taking: Reported on 03/16/2018 06/09/12   Lavone Orn, MD    Family History Family History  Problem Relation Age of Onset  . Heart disease Mother   .  Diabetes Brother   . Hypertension Brother   . Breast cancer Daughter   . Hypertension Daughter   . Cancer Daughter        breast and liver    Social History Social History   Tobacco Use  . Smoking status: Former Smoker    Packs/day: 0.50    Types: Cigarettes  Substance Use Topics  . Alcohol use: No  . Drug use: No     Allergies   Morphine and related   Review of Systems Review of Systems  Constitutional: Negative for appetite change.  HENT: Negative for congestion.   Respiratory: Negative for shortness of breath.   Cardiovascular: Positive for chest pain.  Gastrointestinal: Positive for nausea. Negative for abdominal pain.  Genitourinary: Negative for flank pain.  Musculoskeletal: Negative for back pain.  Neurological: Negative for seizures and weakness.  Hematological: Negative for adenopathy.  Psychiatric/Behavioral: Negative for behavioral problems.     Physical Exam Updated Vital Signs BP  120/84   Pulse 60   Temp (!) 97.5 F (36.4 C) (Oral)   Resp (!) 22   Ht 5\' 6"  (1.676 m)   Wt 131.5 kg (290 lb)   SpO2 92%   BMI 46.81 kg/m   Physical Exam  Constitutional: She appears well-developed.  Patient appears uncomfortable  HENT:  Head: Normocephalic.  Eyes: EOM are normal.  Neck: Normal range of motion.  Cardiovascular: Regular rhythm and normal pulses.  Pulmonary/Chest: Breath sounds normal.  Musculoskeletal:       Right lower leg: She exhibits edema.       Left lower leg: She exhibits edema.  Neurological: She is alert.  Skin: Capillary refill takes less than 2 seconds.     ED Treatments / Results  Labs (all labs ordered are listed, but only abnormal results are displayed) Labs Reviewed  BASIC METABOLIC PANEL - Abnormal; Notable for the following components:      Result Value   Potassium 5.5 (*)    Chloride 114 (*)    CO2 21 (*)    Glucose, Bld 104 (*)    Calcium 6.9 (*)    All other components within normal limits  CBC - Abnormal; Notable for the following components:   MCV 101.9 (*)    Platelets 143 (*)    All other components within normal limits  D-DIMER, QUANTITATIVE (NOT AT [facility]) - Abnormal; Notable for the following components:   D-Dimer, Quant 10.86 (*)    All other components within normal limits  HEPATIC FUNCTION PANEL - Abnormal; Notable for the following components:   Total Protein 4.6 (*)    Albumin 2.6 (*)    Total Bilirubin 1.8 (*)    Bilirubin, Direct 0.7 (*)    Indirect Bilirubin 1.1 (*)    All other components within normal limits  PROTIME-INR - Abnormal; Notable for the following components:   Prothrombin Time 15.6 (*)    All other components within normal limits  LIPASE, BLOOD  APTT  HEPARIN LEVEL (UNFRACTIONATED)  I-STAT TROPONIN, ED    EKG EKG Interpretation  Date/Time:  Saturday March 17 2018 19:33:44 EDT Ventricular Rate:  69 PR Interval:    QRS Duration: 116 QT Interval:  397 QTC Calculation: 426 R  Axis:   -5 Text Interpretation:  Sinus or ectopic atrial rhythm Supraventricular bigeminy LVH with secondary repolarization abnormality Confirmed by Davonna Belling [id number]) on 03/27/2018 8:19:20 PM   Radiology Dg Chest 2 View  Result Date: 03/24/2018 CLINICAL DATA:  Chest and flank pain  EXAM: CHEST - 2 VIEW COMPARISON:  Chest radiograph 02/02/2015 FINDINGS: There is moderate cardiomegaly with bibasilar atelectasis. No focal airspace consolidation or pulmonary edema. Ectatic aorta. No pleural effusion or pneumothorax. IMPRESSION: Cardiomegaly, ectatic aorta and bibasilar atelectasis. Electronically Signed   By: Ulyses Jarred M.D.   On: 02/27/2018 20:24   Ct Angio Chest/abd/pel For Dissection W And/or Wo Contrast  Result Date: 03/28/2018 CLINICAL DATA:  10/10 chest pain with left flank pain starting at 6:30 p.m. EXAM: CT ANGIOGRAPHY CHEST, ABDOMEN AND PELVIS TECHNIQUE: Multidetector CT imaging through the chest, abdomen and pelvis was performed using the standard protocol during bolus administration of intravenous contrast. Multiplanar reconstructed images and MIPs were obtained and reviewed to evaluate the vascular anatomy. CONTRAST:  147mL ISOVUE-370 IOPAMIDOL (ISOVUE-370) INJECTION 76% COMPARISON:  CT AP 11/13/2012, chest CT 06/03/2012, CXR 03/19/2018 FINDINGS: CTA CHEST FINDINGS Cardiovascular: Abnormal crescentic hyperdensity along the anterolateral aspect of the ascending aorta on the unenhanced study is noted. Upon IV contrast administration, there is a penetrating, actively bleeding ulcer along the ascending aorta series 6/28 through 38 contained within the wall of the aorta extending to the beginning of the aortic arch. Coronary arteriosclerosis of the RCA. Cardiomegaly is noted. Trace circumferential pericardial effusion, relatively low-density in appearance on the IV contrast study without active hemorrhage into the pericardial space. Common arterial branch of the right brachiocephalic and left  common carotid arteries. Atherosclerotic origins of the brachiocephalic and left subclavian arteries. Mediastinum/Nodes: Hypodense well-circumscribed 12 x 11 x 8 mm cystic nodule of the left thyroid gland. No thyromegaly. Lungs/Pleura: Dep bibasilar subsegmental atelectasis. No effusion or pneumothorax. No pulmonary consolidation or dominant mass. Musculoskeletal: No chest wall abnormality. No acute or significant osseous findings. Review of the MIP images confirms the above findings. CTA ABDOMEN AND PELVIS FINDINGS VASCULAR Aorta: Normal caliber aorta with scattered atherosclerotic plaque. No abdominal aortic aneurysm, dissection, vasculitis or significant stenosis. Celiac: Patent without evidence of aneurysm, dissection, vasculitis or significant stenosis. SMA: Patent without evidence of aneurysm, dissection, vasculitis or significant stenosis. Renals: Single bilateral patent renal arteries are noted without evidence of aneurysm or dissection. No beaded appearance of the renal arteries to suggest fibromuscular dysplasia. IMA: Patent Inflow: Patent without evidence of aneurysm, dissection, vasculitis or significant stenosis. Veins: No obvious venous abnormality within the limitations of this arterial phase study. Review of the MIP images confirms the above findings. NON-VASCULAR Hepatobiliary: No focal liver abnormality is seen. No gallstones, gallbladder wall thickening, or biliary dilatation. Pancreas: Unremarkable. No pancreatic ductal dilatation or surrounding inflammatory changes. Spleen: Normal in size without focal abnormality. Adrenals/Urinary Tract: Bilateral hypodense adrenal nodules measuring up to 3.7 cm on the right with Hounsfield unit suggestive of a benign adenoma at 18. Indeterminate left adrenal nodule with higher Hounsfield units and suggestion of peripheral vascularity, measuring up to 2.4 cm. Given relative stability since 2014, findings are likely to represent benign findings. Renal cortical  atrophy noted bilaterally. No hydroureteronephrosis. Right-sided renal calculi without obstruction, largest in the renal pelvis measuring up to 11 mm. Stomach/Bowel: Small hiatal hernia. Nondistended stomach with normal small bowel rotation. Normal distal and terminal ileum as well as appendix. Average amount of fecal retention within the colon without obstruction. A ventral hernia measuring 8.7 x 6 x 7.2 cm containing omental fat as well as nonobstructed, non incarcerated large bowel is seen at and just below the level of the umbilicus. Lymphatic: Negative for adenopathy Reproductive: Uterus and bilateral adnexa are unremarkable. Other: No abdominal wall hernia or abnormality. No abdominopelvic ascites.  Musculoskeletal: Degenerative disc disease L5-S1. No aggressive osseous lesions. Review of the MIP images confirms the above findings. IMPRESSION: Vascular: 1. Actively bleeding penetrating ulcer along the anterolateral ascending thoracic aorta concerning for an evolving ascending thoracic type a dissection. No penetration into the pericardial space due to suggest tamponade. 2. Cardiomegaly with coronary arteriosclerosis and aortic atherosclerosis. These results were called by telephone at the time of interpretation on 03/16/2018 at 11:41 pm to Dr. Davonna Belling , who verbally acknowledged these results. Nonvascular: 1. Stable bilateral adrenal nodules likely representing benign adenomas, dating back to 2014. 2. Renal cortical atrophy with nonobstructing right-sided renal calculi. 3. Ventral omental fat and large bowel containing periumbilical hernia without incarceration or bowel obstruction. Electronically Signed   By: Ashley Royalty M.D.   On: 03/19/2018 23:43    Procedures Procedures (including critical care time)  Medications Ordered in ED Medications  iopamidol (ISOVUE-370) 76 % injection (has no administration in time range)  coag fact Xa recombinant (ANDEXXA) low dose infusion 900 mg (has no  administration in time range)  gi cocktail (Maalox,Lidocaine,Donnatal) (30 mLs Oral Given 03/13/2018 2028)  fentaNYL (SUBLIMAZE) injection 50 mcg (50 mcg Intravenous Given 02/28/2018 2118)  HYDROmorphone (DILAUDID) injection 0.5 mg (0.5 mg Intravenous Given 03/14/2018 2152)  ondansetron (ZOFRAN) injection 4 mg (4 mg Intravenous Given 03/23/2018 2152)  iopamidol (ISOVUE-370) 76 % injection 100 mL (100 mLs Intravenous Contrast Given 03/11/2018 2244)     Initial Impression / Assessment and Plan / ED Course  I have reviewed the triage vital signs and the nursing notes.  Pertinent labs & imaging results that were available during my care of the patient were reviewed by me and considered in my medical decision making (see chart for details).     Patient with ascending aortic  dissection chest pain began acutely.  Does have mild hypoxia.  With some bleeding.  On Xarelto.  Will be emergently reversed. Will discuss with cardiothoracic surgery.  Discussed with Dr. Darcey Nora at 1225.  He will come and see patient.  CRITICAL CARE Performed by: Davonna Belling Total critical care time: 30 minutes Critical care time was exclusive of separately billable procedures and treating other patients. Critical care was necessary to treat or prevent imminent or life-threatening deterioration. Critical care was time spent personally by me on the following activities: development of treatment plan with patient and/or surrogate as well as nursing, discussions with consultants, evaluation of patient's response to treatment, examination of patient, obtaining history from patient or surrogate, ordering and performing treatments and interventions, ordering and review of laboratory studies, ordering and review of radiographic studies, pulse oximetry and re-evaluation of patient's condition.   Final Clinical Impressions(s) / ED Diagnoses   Final diagnoses:  Dissection of thoracic aorta [facility])    ED Discharge Orders    None        Davonna Belling, MD 2018-03-19 [phone]

## 2018-03-17 NOTE — ED Triage Notes (Signed)
Patient arrived via ems, from home, c/o 10/10 CP and L flank pain today that started around 6:30pm. On 4L O2, SpO2 94%. AOx4.

## 2018-03-18 ENCOUNTER — Inpatient Hospital Stay (HOSPITAL_COMMUNITY): Payer: Medicare Other

## 2018-03-18 ENCOUNTER — Encounter (HOSPITAL_COMMUNITY): Payer: Self-pay | Admitting: *Deleted

## 2018-03-18 DIAGNOSIS — R5381 Other malaise: Secondary | ICD-10-CM | POA: Diagnosis present

## 2018-03-18 DIAGNOSIS — Z79899 Other long term (current) drug therapy: Secondary | ICD-10-CM | POA: Diagnosis not present

## 2018-03-18 DIAGNOSIS — R001 Bradycardia, unspecified: Secondary | ICD-10-CM | POA: Diagnosis not present

## 2018-03-18 DIAGNOSIS — R0681 Apnea, not elsewhere classified: Secondary | ICD-10-CM | POA: Diagnosis not present

## 2018-03-18 DIAGNOSIS — I4891 Unspecified atrial fibrillation: Secondary | ICD-10-CM | POA: Diagnosis present

## 2018-03-18 DIAGNOSIS — I10 Essential (primary) hypertension: Secondary | ICD-10-CM | POA: Diagnosis present

## 2018-03-18 DIAGNOSIS — I7100 Dissection of unspecified site of aorta: Secondary | ICD-10-CM | POA: Diagnosis present

## 2018-03-18 DIAGNOSIS — Z86718 Personal history of other venous thrombosis and embolism: Secondary | ICD-10-CM | POA: Diagnosis not present

## 2018-03-18 DIAGNOSIS — F329 Major depressive disorder, single episode, unspecified: Secondary | ICD-10-CM | POA: Diagnosis present

## 2018-03-18 DIAGNOSIS — Z86711 Personal history of pulmonary embolism: Secondary | ICD-10-CM | POA: Diagnosis not present

## 2018-03-18 DIAGNOSIS — Z87891 Personal history of nicotine dependence: Secondary | ICD-10-CM | POA: Diagnosis not present

## 2018-03-18 DIAGNOSIS — R079 Chest pain, unspecified: Secondary | ICD-10-CM | POA: Diagnosis not present

## 2018-03-18 DIAGNOSIS — Z9849 Cataract extraction status, unspecified eye: Secondary | ICD-10-CM | POA: Diagnosis not present

## 2018-03-18 DIAGNOSIS — I959 Hypotension, unspecified: Secondary | ICD-10-CM | POA: Diagnosis not present

## 2018-03-18 DIAGNOSIS — I7101 Dissection of thoracic aorta: Secondary | ICD-10-CM | POA: Diagnosis present

## 2018-03-18 DIAGNOSIS — Z66 Do not resuscitate: Secondary | ICD-10-CM | POA: Diagnosis present

## 2018-03-18 DIAGNOSIS — K439 Ventral hernia without obstruction or gangrene: Secondary | ICD-10-CM | POA: Diagnosis present

## 2018-03-18 DIAGNOSIS — R0989 Other specified symptoms and signs involving the circulatory and respiratory systems: Secondary | ICD-10-CM | POA: Diagnosis not present

## 2018-03-18 DIAGNOSIS — Z7901 Long term (current) use of anticoagulants: Secondary | ICD-10-CM | POA: Diagnosis not present

## 2018-03-18 DIAGNOSIS — Z6841 Body Mass Index (BMI) 40.0 and over, adult: Secondary | ICD-10-CM | POA: Diagnosis not present

## 2018-03-18 DIAGNOSIS — R0902 Hypoxemia: Secondary | ICD-10-CM | POA: Diagnosis present

## 2018-03-18 DIAGNOSIS — Z885 Allergy status to narcotic agent status: Secondary | ICD-10-CM | POA: Diagnosis not present

## 2018-03-18 LAB — LIPID PANEL
Cholesterol: 201 mg/dL — ABNORMAL HIGH (ref 0–200)
HDL: 59 mg/dL (ref >40)
LDL Cholesterol: 134 mg/dL — ABNORMAL HIGH (ref 0–99)
Total CHOL/HDL Ratio: 3.4 RATIO
Triglycerides: 42 mg/dL (ref <150)
VLDL: 8 mg/dL (ref 0–40)

## 2018-03-18 LAB — CBC
HCT: 48.1 % — ABNORMAL HIGH (ref 36.0–46.0)
Hemoglobin: 15.5 g/dL — ABNORMAL HIGH (ref 12.0–15.0)
MCH: 31.6 pg (ref 26.0–34.0)
MCHC: 32.2 g/dL (ref 30.0–36.0)
MCV: 98.2 fL (ref 78.0–100.0)
Platelets: 185 10*3/uL (ref 150–400)
RBC: 4.9 MIL/uL (ref 3.87–5.11)
RDW: 13.8 % (ref 11.5–15.5)
WBC: 11.7 10*3/uL — ABNORMAL HIGH (ref 4.0–10.5)

## 2018-03-18 LAB — APTT
APTT: 31 s (ref 24–36)
aPTT: 28 seconds (ref 24–36)

## 2018-03-18 LAB — COMPREHENSIVE METABOLIC PANEL
ALT: 13 U/L (ref 0–44)
AST: 22 U/L (ref 15–41)
Albumin: 3.7 g/dL (ref 3.5–5.0)
Alkaline Phosphatase: 59 U/L (ref 38–126)
Anion gap: 11 (ref 5–15)
BUN: 22 mg/dL (ref 8–23)
CO2: 26 mmol/L (ref 22–32)
Calcium: 9.2 mg/dL (ref 8.9–10.3)
Chloride: 107 mmol/L (ref 98–111)
Creatinine, Ser: 0.89 mg/dL (ref 0.44–1.00)
GFR calc Af Amer: >60 mL/min (ref >60)
GFR calc non Af Amer: 60 mL/min — ABNORMAL LOW (ref >60)
Glucose, Bld: 192 mg/dL — ABNORMAL HIGH (ref 70–99)
Potassium: 4.1 mmol/L (ref 3.5–5.1)
Sodium: 144 mmol/L (ref 135–145)
Total Bilirubin: 1.1 mg/dL (ref 0.3–1.2)
Total Protein: 6.6 g/dL (ref 6.5–8.1)

## 2018-03-18 LAB — PROTIME-INR
INR: 1.03
INR: 1.25
PROTHROMBIN TIME: 15.6 s — AB (ref 11.4–15.2)
Prothrombin Time: 13.4 seconds (ref 11.4–15.2)

## 2018-03-18 LAB — HEMOGLOBIN A1C
Hgb A1c MFr Bld: 5.7 % — ABNORMAL HIGH (ref 4.8–5.6)
Mean Plasma Glucose: 116.89 mg/dL

## 2018-03-18 LAB — MRSA PCR SCREENING: MRSA by PCR: NEGATIVE

## 2018-03-18 LAB — HEPARIN LEVEL (UNFRACTIONATED): Heparin Unfractionated: 2.08 IU/mL — ABNORMAL HIGH (ref 0.30–0.70)

## 2018-03-18 MED ORDER — LEVALBUTEROL HCL 1.25 MG/0.5ML IN NEBU
1.2500 mg | INHALATION_SOLUTION | Freq: Three times a day (TID) | RESPIRATORY_TRACT | Status: DC
  Administered 2018-03-18: 1.25 mg via RESPIRATORY_TRACT
  Filled 2018-03-18: qty 0.5

## 2018-03-18 MED ORDER — FENTANYL CITRATE (PF) 100 MCG/2ML IJ SOLN
50.0000 ug | INTRAMUSCULAR | Status: DC | PRN
  Administered 2018-03-18 (×3): 50 ug via INTRAVENOUS
  Filled 2018-03-18 (×5): qty 2

## 2018-03-18 MED ORDER — AMLODIPINE BESYLATE 5 MG PO TABS
5.0000 mg | ORAL_TABLET | Freq: Every day | ORAL | Status: DC
  Administered 2018-03-18: 5 mg via ORAL
  Filled 2018-03-18: qty 1

## 2018-03-18 MED ORDER — DEXTROSE-NACL 5-0.45 % IV SOLN
INTRAVENOUS | Status: DC
  Administered 2018-03-18: 75 mL/h via INTRAVENOUS

## 2018-03-18 MED ORDER — LOSARTAN POTASSIUM 50 MG PO TABS
100.0000 mg | ORAL_TABLET | Freq: Every day | ORAL | Status: DC
  Administered 2018-03-18: 100 mg via ORAL
  Filled 2018-03-18: qty 2

## 2018-03-18 MED ORDER — INSULIN ASPART 100 UNIT/ML ~~LOC~~ SOLN: 0.0000 [IU] | Freq: Every day | SUBCUTANEOUS | Status: DC

## 2018-03-18 MED ORDER — HYDROMORPHONE HCL 1 MG/ML IJ SOLN
0.5000 mg | Freq: Once | INTRAMUSCULAR | Status: AC
  Administered 2018-03-18: 0.5 mg via INTRAVENOUS
  Filled 2018-03-18: qty 1

## 2018-03-18 MED ORDER — FUROSEMIDE 40 MG PO TABS
40.0000 mg | ORAL_TABLET | Freq: Every day | ORAL | Status: DC
  Administered 2018-03-18: 40 mg via ORAL
  Filled 2018-03-18: qty 1

## 2018-03-18 MED ORDER — BUPROPION HCL ER (XL) 150 MG PO TB24
150.0000 mg | ORAL_TABLET | Freq: Every day | ORAL | Status: DC
  Administered 2018-03-18: 150 mg via ORAL
  Filled 2018-03-18: qty 1

## 2018-03-18 MED ORDER — ONDANSETRON HCL 4 MG/2ML IJ SOLN: 4.0000 mg | Freq: Four times a day (QID) | INTRAMUSCULAR | Status: DC | PRN

## 2018-03-18 MED ORDER — PROMETHAZINE HCL 25 MG/ML IJ SOLN: 12.5000 mg | Freq: Four times a day (QID) | INTRAMUSCULAR | Status: DC | PRN

## 2018-03-18 MED ORDER — VENLAFAXINE HCL ER 75 MG PO CP24
75.0000 mg | ORAL_CAPSULE | Freq: Every day | ORAL | Status: DC
  Filled 2018-03-18: qty 1

## 2018-03-18 MED ORDER — ATORVASTATIN CALCIUM 40 MG PO TABS: 40.0000 mg | ORAL_TABLET | Freq: Every day | ORAL | Status: DC

## 2018-03-18 MED ORDER — LABETALOL HCL 5 MG/ML IV SOLN
0.5000 mg/min | INTRAVENOUS | Status: DC
  Administered 2018-03-18: 0.5 mg/min via INTRAVENOUS
  Filled 2018-03-18: qty 100

## 2018-03-18 MED ORDER — FENTANYL 75 MCG/HR TD PT72
75.0000 ug | MEDICATED_PATCH | TRANSDERMAL | Status: DC
  Administered 2018-03-18: 75 ug via TRANSDERMAL
  Filled 2018-03-18: qty 1

## 2018-03-18 MED ORDER — INSULIN ASPART 100 UNIT/ML ~~LOC~~ SOLN: 0.0000 [IU] | Freq: Three times a day (TID) | SUBCUTANEOUS | Status: DC

## 2018-03-18 MED ORDER — TRAZODONE HCL 50 MG PO TABS: 25.0000 mg | ORAL_TABLET | Freq: Every evening | ORAL | Status: DC | PRN

## 2018-03-29 NOTE — Plan of Care (Signed)
Pt noted to have drop in HR on monitor at 0955, upon assessment she was apneic and pulseless with a junctional heart rhythm. Dr. Prescott Gum notified of change in condition and came to bedside. Family spoke with MD and reaffirmed DNR status.

## 2018-03-29 NOTE — Plan of Care (Signed)
Pt rhythm is asystole. Heart tones auscultated by Jani Gravel RN and Shiloh Desanctis RN for 2 minutes, and no sounds heard. Family at bedside, support offered/given to family. Time of death noted at 49.

## 2018-03-29 NOTE — H&P (Signed)
[address]       [place] [address]             [phone]        Brittany [name] [place] Medical Record #[id number] Date of Birth: [birth year]  Referring: Dr Alvino Chapel Primary Care: Lavone Orn, MD Primary Cardiologist:No primary care provider on file.  Chief Complaint:    Chief Complaint  Patient presents with  . Chest Pain  Patient examined, CTA images of thoracic and abdominal aorta personally reviewed and counseled with patient  History of Present Illness:     83 year old morbid obese hypertensive female on chronic Xarelto for history of DVT and right PE 6 years ago presents with chest pain starting approximately 6 hours ago, severe and penetrating straight to her back.  She was moderately hypertensive in the ED and d-dimer was elevated.  She was in intermittent atrial fibrillation. CTA shows a penetrating ulcer with short segment dissection of the ascending aorta just proximal to the takeoff of the innominate artery and arch.  It does not involve the arch vessels or coronary vessels.  Patient's previous echo 6 years ago showed normal LV EF with trace AI.  ED the patient has received IV Dilaudid and IV Andexxa low-dose to reverse the Xarelto.  Current Activity/ Functional Status: Patient lives with family and is limited in her mobility because of her obesity, weight 300 pounds BMI 48   Zubrod Score: At the time of surgery this patient's most appropriate activity status/level should be described as: []     0    Normal activity, no symptoms []     1    Restricted in physical strenuous activity but ambulatory, able to do out light work []     2    Ambulatory and capable of self care, unable to do work activities, up and about                 more than 50%  Of the time                            [x]     3    Only limited self care, in bed greater than 50% of waking hours []     4    Completely disabled, no self care, confined to bed or chair []     5     Moribund  Past Medical History:  Diagnosis Date  . Arthritis   . Cataract   . Cellulitis   . Clotting disorder (HCC)    Previous blood clot in leg  . Collagen vascular disease ([place])   . Depression   . Hypertension     Past Surgical History:  Procedure Laterality Date  . ABDOMINAL SURGERY  0539   UMBILICAL HERNIA  . CATARACT EXTRACTION    . FOOT SURGERY    . OOPHORECTOMY  2012   BSO  ENDOSALPINGIOSIS  . PELVIC LAPAROSCOPY  2012   BORDERLINE OVARIAN TUMOR SEROUS RIGHT  . VARICOSE VEINS      Social History   Tobacco Use  Smoking Status Former Smoker  . Packs/day: 0.50  . Types: Cigarettes   Social History   Substance and Sexual Activity  Alcohol Use No     Allergies  Allergen Reactions  . Morphine And Related Nausea And Vomiting    Current Facility-Administered Medications  Medication Dose Route Frequency Provider Last Rate Last Dose  . amLODipine (NORVASC)  tablet 5 mg  5 mg Oral Daily Prescott Gum, Collier Salina, MD      . atorvastatin (LIPITOR) tablet 40 mg  40 mg Oral q1800 Prescott Gum, Collier Salina, MD      . buPROPion (WELLBUTRIN XL) 24 hr tablet 150 mg  150 mg Oral Daily Prescott Gum, Collier Salina, MD      . dextrose 5 %-0.45 % sodium chloride infusion   Intravenous Continuous Prescott Gum, Collier Salina, MD      . fentaNYL (Johnson - dosed mcg/hr) patch 75 mcg  75 mcg Transdermal Q72H Prescott Gum, Collier Salina, MD      . fentaNYL (SUBLIMAZE) injection 50 mcg  50 mcg Intravenous Q1H PRN Prescott Gum, Collier Salina, MD      . iopamidol (ISOVUE-370) 76 % injection           . labetalol (NORMODYNE,TRANDATE) 500 mg in dextrose 5 % 125 mL (4 mg/mL) infusion  0.5-3 mg/min Intravenous Titrated Prescott Gum, Collier Salina, MD      . levalbuterol Penne Lash) nebulizer solution 1.25 mg  1.25 mg Nebulization TID Prescott Gum, Collier Salina, MD      . losartan (COZAAR) tablet 100 mg  100 mg Oral Daily Prescott Gum, Collier Salina, MD      . promethazine [facility]) injection 12.5 mg  12.5 mg Intravenous Q6H PRN Prescott Gum, Collier Salina, MD      . traZODone (DESYREL)  tablet 25 mg  25 mg Oral QHS PRN Prescott Gum, Collier Salina, MD      . venlafaxine XR (EFFEXOR-XR) 24 hr capsule 75 mg  75 mg Oral Q breakfast Ivin Poot, MD       Current Outpatient Medications  Medication Sig Dispense Refill  . acetaminophen (TYLENOL) 500 MG tablet Take 500-1,000 mg by mouth every 6 (six) hours as needed (for pain or headaches).     Marland Kitchen amLODipine (NORVASC) 5 MG tablet Take 5 mg by mouth every morning.     Marland Kitchen buPROPion (WELLBUTRIN XL) 150 MG 24 hr tablet Take 150 mg by mouth daily.    Marland Kitchen HYDROcodone-acetaminophen (NORCO/VICODIN) 5-325 MG tablet Take 1 tablet by mouth at bedtime as needed (for pain).   0  . losartan-hydrochlorothiazide (HYZAAR) 100-25 MG tablet Take 1 tablet by mouth daily.    . rivaroxaban (XARELTO) 20 MG TABS tablet Take 20 mg by mouth at bedtime.     Marland Kitchen venlafaxine (EFFEXOR XR) 75 MG 24 hr capsule Take 225 mg by mouth every morning.     Marland Kitchen albuterol (PROVENTIL HFA;VENTOLIN HFA) 108 (90 BASE) MCG/ACT inhaler Inhale 2 puffs into the lungs every 6 (six) hours as needed for wheezing or shortness of breath. (Patient not taking: Reported on 03/05/2018) 1 Inhaler 1     (Not in a hospital admission)  Family History  Problem Relation Age of Onset  . Heart disease Mother   . Diabetes Brother   . Hypertension Brother   . Breast cancer Daughter   . Hypertension Daughter   . Cancer Daughter        breast and liver     Review of Systems:   ROS      Cardiac Review of Systems: Y or  [    ]= no  Chest Pain [  y  ]  Resting SOB Blue.Reese   ] Exertional SOB  Blue.Reese  ]  Pontianus.Latina [  ]   Pedal Edema [   ]    Palpitations Blue.Reese  ] Syncope  [  ]   Presyncope [   ]  General  Review of Systems: [Y] = yes [  ]=no Constitional: recent weight change [  ]; anorexia [  ]; fatigue [  ]; nausea Blue.Reese  ]; night sweats [  ]; fever [  ]; or chills [  ]                                                               Dental: Last Dentist visit: Dental plates  Eye : blurred vision [  ]; diplopia [   ];  vision changes [  ];  Amaurosis fugax[  ]; Resp: cough [  ];  wheezing[  ];  hemoptysis[  ]; shortness of breath[y  ]; paroxysmal nocturnal dyspnea[  ]; dyspnea on exertion[  ]; or orthopnea[  ];  GI:  gallstones[  ], vomiting[  ];  dysphagia[  ]; melena[  ];  hematochezia [  ]; heartburn[y  ];   Hx of  Colonoscopy[  ]; history of large ventral hernia GU: kidney stones [ y nonobstructive on right side]; hematuria[  ];   dysuria [  ];  nocturia[  ];  history of     obstruction [  ]; urinary frequency [  ]             Skin: rash, swelling[  ];, hair loss[  ];  peripheral edema[  ];  or itching[  ]; Musculosketetal: myalgias[  ];  joint swelling[  ];  joint erythema[  ];  joint pain[  ];  back pain[  ];  Heme/Lymph: bruising[  ];  bleeding[  ];  anemia[  ];                  atypical menstrual bleeding the last 3 months Neuro: TIA[  ];  headaches[  ];  stroke[  ];  vertigo[  ];  seizures[  ];   paresthesias[  ];  difficulty walking[  y]; left hand dominant  Psych:depression[y  ]; anxiety[  ];  Endocrine: diabetes[  ];  thyroid dysfunction[  ];                         Physical Exam: BP (!) 142/86 (BP Location: Right Arm)   Pulse 78   Temp (!) 97.5 F (36.4 C) (Oral)   Resp 18   Ht 5\' 6"  (1.676 m)   Wt 290 lb (131.5 kg)   SpO2 90%   BMI 46.81 kg/m         Exam    General- alert but uncomfortable complaining of chest abdominal pain dry mouth, morbidly obese with distended abdomen    Neck- no JVD, no cervical adenopathy palpable, no carotid bruit   Lungs-distant breath sounds without rales, wheezes   Cor irregular rate and rhythm with PACs, no murmur , gallop   Abdomen-obese distended tender but without guarding managed bowel sounds   Extremities - non-tender, minimal edema, pedal pulses nonpalpable   Neuro- oriented, appropriate, no focal weakness   Diagnostic Studies & Laboratory data:     Recent Radiology Findings:   Dg Chest 2 View  Result Date: 03/17/2018 CLINICAL DATA:   Chest and flank pain EXAM: CHEST - 2 VIEW COMPARISON:  Chest radiograph 02/02/2015 FINDINGS: There is moderate cardiomegaly with bibasilar atelectasis. No focal airspace  consolidation or pulmonary edema. Ectatic aorta. No pleural effusion or pneumothorax. IMPRESSION: Cardiomegaly, ectatic aorta and bibasilar atelectasis. Electronically Signed   By: Ulyses Jarred M.D.   On: 03/06/2018 20:24   Ct Angio Chest/abd/pel For Dissection W And/or Wo Contrast  Result Date: 03/06/2018 CLINICAL DATA:  10/10 chest pain with left flank pain starting at 6:30 p.m. EXAM: CT ANGIOGRAPHY CHEST, ABDOMEN AND PELVIS TECHNIQUE: Multidetector CT imaging through the chest, abdomen and pelvis was performed using the standard protocol during bolus administration of intravenous contrast. Multiplanar reconstructed images and MIPs were obtained and reviewed to evaluate the vascular anatomy. CONTRAST:  172mL ISOVUE-370 IOPAMIDOL (ISOVUE-370) INJECTION 76% COMPARISON:  CT AP 11/13/2012, chest CT 06/03/2012, CXR 03/24/2018 FINDINGS: CTA CHEST FINDINGS Cardiovascular: Abnormal crescentic hyperdensity along the anterolateral aspect of the ascending aorta on the unenhanced study is noted. Upon IV contrast administration, there is a penetrating, actively bleeding ulcer along the ascending aorta series 6/28 through 38 contained within the wall of the aorta extending to the beginning of the aortic arch. Coronary arteriosclerosis of the RCA. Cardiomegaly is noted. Trace circumferential pericardial effusion, relatively low-density in appearance on the IV contrast study without active hemorrhage into the pericardial space. Common arterial branch of the right brachiocephalic and left common carotid arteries. Atherosclerotic origins of the brachiocephalic and left subclavian arteries. Mediastinum/Nodes: Hypodense well-circumscribed 12 x 11 x 8 mm cystic nodule of the left thyroid gland. No thyromegaly. Lungs/Pleura: Dep bibasilar subsegmental atelectasis.  No effusion or pneumothorax. No pulmonary consolidation or dominant mass. Musculoskeletal: No chest wall abnormality. No acute or significant osseous findings. Review of the MIP images confirms the above findings. CTA ABDOMEN AND PELVIS FINDINGS VASCULAR Aorta: Normal caliber aorta with scattered atherosclerotic plaque. No abdominal aortic aneurysm, dissection, vasculitis or significant stenosis. Celiac: Patent without evidence of aneurysm, dissection, vasculitis or significant stenosis. SMA: Patent without evidence of aneurysm, dissection, vasculitis or significant stenosis. Renals: Single bilateral patent renal arteries are noted without evidence of aneurysm or dissection. No beaded appearance of the renal arteries to suggest fibromuscular dysplasia. IMA: Patent Inflow: Patent without evidence of aneurysm, dissection, vasculitis or significant stenosis. Veins: No obvious venous abnormality within the limitations of this arterial phase study. Review of the MIP images confirms the above findings. NON-VASCULAR Hepatobiliary: No focal liver abnormality is seen. No gallstones, gallbladder wall thickening, or biliary dilatation. Pancreas: Unremarkable. No pancreatic ductal dilatation or surrounding inflammatory changes. Spleen: Normal in size without focal abnormality. Adrenals/Urinary Tract: Bilateral hypodense adrenal nodules measuring up to 3.7 cm on the right with Hounsfield unit suggestive of a benign adenoma at 18. Indeterminate left adrenal nodule with higher Hounsfield units and suggestion of peripheral vascularity, measuring up to 2.4 cm. Given relative stability since 2014, findings are likely to represent benign findings. Renal cortical atrophy noted bilaterally. No hydroureteronephrosis. Right-sided renal calculi without obstruction, largest in the renal pelvis measuring up to 11 mm. Stomach/Bowel: Small hiatal hernia. Nondistended stomach with normal small bowel rotation. Normal distal and terminal ileum as  well as appendix. Average amount of fecal retention within the colon without obstruction. A ventral hernia measuring 8.7 x 6 x 7.2 cm containing omental fat as well as nonobstructed, non incarcerated large bowel is seen at and just below the level of the umbilicus. Lymphatic: Negative for adenopathy Reproductive: Uterus and bilateral adnexa are unremarkable. Other: No abdominal wall hernia or abnormality. No abdominopelvic ascites. Musculoskeletal: Degenerative disc disease L5-S1. No aggressive osseous lesions. Review of the MIP images confirms the above findings. IMPRESSION: Vascular: 1.  Actively bleeding penetrating ulcer along the anterolateral ascending thoracic aorta concerning for an evolving ascending thoracic type a dissection. No penetration into the pericardial space due to suggest tamponade. 2. Cardiomegaly with coronary arteriosclerosis and aortic atherosclerosis. These results were called by telephone at the time of interpretation on 03/19/2018 at 11:41 pm to Dr. Davonna Belling , who verbally acknowledged these results. Nonvascular: 1. Stable bilateral adrenal nodules likely representing benign adenomas, dating back to 2014. 2. Renal cortical atrophy with nonobstructing right-sided renal calculi. 3. Ventral omental fat and large bowel containing periumbilical hernia without incarceration or bowel obstruction. Electronically Signed   By: Ashley Royalty M.D.   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  No pericardial effusion.  83 years old with debility from obesity Morbid obesity BMI 48.2 Hypertension History of DVT, pulmonary emboli on chronic Xarelto  Patient will be treated medically by reversing Xarelto, blood pressure control, beta-blocker and statin.  She would not survive surgery for replacement of ascending aorta under hypothermic circulatory arrest at age 35 with her severe comorbidities.  Patient will be given pain medication and medical care and observation.  I discussed with patient and family that she  will be DNR for any acute catastrophic event.  They agree.     @ME1 @ 04/05/2018 1:52 AM

## 2018-03-29 NOTE — Progress Notes (Signed)
  Subjective: Patient with stable vitals thru night  but with increased O2 demands CXR stable She developed sudden bradycardia , low BP and apnea. She was given iv fentanyl. She quietly expired about 10:40 am with family at bedside  Objective: Vital signs in last 24 hours: Temp:  [97.5 F (36.4 C)-98.5 F (36.9 C)] 98.5 F (36.9 C) (07/21 0805) Pulse Rate:  [41-91] 71 (07/21 0900) Cardiac Rhythm: Atrial fibrillation (07/21 0800) Resp:  [15-29] 15 (07/21 0900) BP: (106-157)/(64-106) 118/69 (07/21 0900) SpO2:  [82 %-98 %] 93 % (07/21 0900) Weight:  [290 lb (131.5 kg)] 290 lb (131.5 kg) (07/20 1937)  Hemodynamic parameters for last 24 hours:    Intake/Output from previous day: 07/20 0701 - 07/21 0700 In: 425 [I.V.:335; IV Piggyback:90] Out: 250 [Urine:250] Intake/Output this shift: Total I/O In: 149.9 [I.V.:149.9] Out: 300 [Urine:300]    Lab Results: Recent Labs    03/15/2018 1939 2018/03/28 0237  WBC 5.7 11.7*  HGB 13.0 15.5*  HCT 42.0 48.1*  PLT 143* 185   BMET:  Recent Labs    03/24/2018 1939 28-Mar-2018 0237  NA 144 144  K 5.5* 4.1  CL 114* 107  CO2 21* 26  GLUCOSE 104* 192*  BUN 19 22  CREATININE 0.67 0.89  CALCIUM 6.9* 9.2    PT/INR:  Recent Labs    03-28-2018 0237  LABPROT 13.4  INR 1.03   ABG No results found for: PHART, HCO3, TCO2, ACIDBASEDEF, O2SAT CBG (last 3)  No results for input(s): GLUCAP in the last 72 hours.  Assessment/Plan: S/P  Ascending aortic penetrating ulcer with type A dissection, hx PE and morbid obesity  DNR  Due to advanced age and comorbidities   LOS: 0 days    Tharon Aquas Trigt III 03-28-18

## 2018-03-29 NOTE — Death Summary Note (Signed)
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  She was moderately hypertensive in the ED and d-dimer was elevated.  She was in intermittent atrial fibrillation. CTA shows a penetrating ulcer with short segment dissection of the ascending aorta just proximal to the takeoff of the innominate artery and arch.  It does not involve the arch vessels or coronary vessels.  Patient's previous echo 6 years ago showed normal LV EF with trace AI.  The patient was treated with pain medication, labetalol, and reversal of Xarelto with Andexxa in the emergency department.  After reviewing her CT scan examining the patient and her medical record I do not feel she was a surgical candidate for extensive surgery [which would require replacement of her ascending aorta using cardio pulmonary bypass and hypothermic circulatory arrest]  due to her advanced age, severe obesity, and chronic lung disease from prior pulmonary emboli.  I felt that her realistic chances of survival with a positive outcome were none and told the patient and her family that I would not recommend surgery when I felt it would not help. We agreed to admit her for pain control blood pressure control reversal of anticoagulation and careful monitoring to give the aorta a chance to heal and remodel.  The patient and I both agreed on a DNR in the event of catastrophic event.  Patient was treated  with pain medicine and PRN labetalol overnight and remained stable with clear chest x-ray.  She did require increasing oxygen requirements overnight to maintain saturation.  Shortly after she was visited by family on the a.m. of 2023-03-21 she had a sudden episode of bradycardia hypotension and apnea and expired quietly shortly thereafter.    Final diagnoses  Acute type A dissection of the ascending aorta Morbid obesity Chronic lung disease from prior history of pulmonary emboli  Dahlia Byes MD

## 2018-03-29 DEATH — deceased
